# Patient Record
Sex: Female | Born: 1990
Health system: Southern US, Community
[De-identification: ages and names within clinical notes are randomized; demographics above are authoritative.]

## PROBLEM LIST (undated history)

## (undated) ENCOUNTER — Inpatient Hospital Stay (HOSPITAL_COMMUNITY): Payer: Self-pay

## (undated) DIAGNOSIS — O24419 Gestational diabetes mellitus in pregnancy, unspecified control: Secondary | ICD-10-CM

## (undated) DIAGNOSIS — D649 Anemia, unspecified: Secondary | ICD-10-CM

---

## 2014-02-13 DIAGNOSIS — D509 Iron deficiency anemia, unspecified: Secondary | ICD-10-CM | POA: Insufficient documentation

## 2014-07-24 DIAGNOSIS — F418 Other specified anxiety disorders: Secondary | ICD-10-CM | POA: Insufficient documentation

## 2017-05-25 DIAGNOSIS — Z6839 Body mass index (BMI) 39.0-39.9, adult: Secondary | ICD-10-CM | POA: Insufficient documentation

## 2017-05-25 DIAGNOSIS — Z6833 Body mass index (BMI) 33.0-33.9, adult: Secondary | ICD-10-CM | POA: Insufficient documentation

## 2017-08-21 ENCOUNTER — Ambulatory Visit: Payer: Self-pay

## 2017-08-23 ENCOUNTER — Ambulatory Visit (INDEPENDENT_AMBULATORY_CARE_PROVIDER_SITE_OTHER): Payer: Self-pay | Admitting: *Deleted

## 2017-08-23 ENCOUNTER — Encounter: Payer: Self-pay | Admitting: Family Medicine

## 2017-08-23 DIAGNOSIS — Z3201 Encounter for pregnancy test, result positive: Secondary | ICD-10-CM

## 2017-08-23 LAB — POCT PREGNANCY, URINE: Preg Test, Ur: POSITIVE — AB

## 2017-08-23 NOTE — Progress Notes (Signed)
Per Bonita QuinLinda, pregnancy test positive.  Confirmed with patient only medications she is currently taking is PNV and lexapro 20 mg QD.  Spoke with Dr. Vergie LivingPickens to verify patient should continue her lexapro.  He states patient should continue.  Made patient aware to continue both medications.  Patient is starting work with [employer] soon and will have insurance.  Will schedule appointment here in our office as she checks out.

## 2017-08-27 NOTE — Progress Notes (Signed)
I have reviewed the chart and agree with nursing staff's documentation of this patient's encounter.  [place] Bingharlie Dimas Scheck, MD 08/27/2017 5:09 PM

## 2017-09-15 ENCOUNTER — Emergency Department (HOSPITAL_COMMUNITY)
Admission: EM | Admit: 2017-09-15 | Discharge: 2017-09-15 | Disposition: A | Discharge status: Home / Self Care | Payer: Self-pay | Attending: Emergency Medicine | Admitting: Emergency Medicine

## 2017-09-15 ENCOUNTER — Other Ambulatory Visit: Payer: Self-pay

## 2017-09-15 ENCOUNTER — Encounter (HOSPITAL_COMMUNITY): Payer: Self-pay

## 2017-09-15 DIAGNOSIS — Z87891 Personal history of nicotine dependence: Secondary | ICD-10-CM | POA: Insufficient documentation

## 2017-09-15 DIAGNOSIS — K0889 Other specified disorders of teeth and supporting structures: Secondary | ICD-10-CM | POA: Insufficient documentation

## 2017-09-15 DIAGNOSIS — Z79899 Other long term (current) drug therapy: Secondary | ICD-10-CM | POA: Insufficient documentation

## 2017-09-15 MED ORDER — PENICILLIN V POTASSIUM 500 MG PO TABS: 500.0000 mg | ORAL_TABLET | Freq: Four times a day (QID) | ORAL | 0 refills | Status: AC

## 2017-09-15 NOTE — ED Triage Notes (Signed)
Dental pain x 3 days. Pt is pregnant.  Only taking tylenol.  Unknown for home fever.  Swelling on left side.

## 2017-09-15 NOTE — ED Notes (Signed)
Bed: WTR6 Expected date:  Expected time:  Means of arrival:  Comments: 

## 2017-09-15 NOTE — ED Notes (Signed)
PA at bedside.

## 2017-09-15 NOTE — ED Provider Notes (Signed)
[facility]-EMERGENCY DEPT Provider Note   CSN: [id number] Arrival date & time: 09/15/17  1125     History   Chief Complaint Chief Complaint  Patient presents with  . Dental Pain    HPI Kelly Morales is a 27 y.o. female.  HPI   Pt is a 27 y/o female with no significant past medical history who presents to the ED today c/o left lower dental pain that has been present for the last 4 days. States she also has swelling to left lower area of the mouth. States she has been taking tylenol and orajel with mild relief. States pain is 7-8/10. Pain is constant. Denies any known drainage from the area and denies any documented fevers at home. Reports left sided ear pain. But no significant headaches, chset pain, SOB, abd pain, NV.  Has been able to eat and drink at home though this is painful.  Pt states she is [redacted] weeks pregnant by LMP. States she has not had an US yet but is scheduled for one next week and is currently set up with ob-gyn.   She states she does not have a dentist.  History reviewed. No pertinent past medical history.  There are no active problems to display for this patient.   History reviewed. No pertinent surgical history.   OB History    Gravida  1   Para      Term      Preterm      AB      Living        SAB      TAB      Ectopic      Multiple      Live Births               Home Medications    Prior to Admission medications   Medication Sig Start Date End Date Taking? Authorizing Provider  escitalopram (LEXAPRO) 20 MG tablet Take 20 mg by mouth daily.    [provider]  penicillin v potassium (VEETID) 500 MG tablet Take 1 tablet (500 mg total) by mouth 4 (four) times daily for 7 days. 09/15/17 09/22/17  Luwana Butrick S, PA-C  Prenatal Vit-Fe Fumarate-FA (MULTIVITAMIN-PRENATAL) 27-0.8 MG TABS tablet Take 1 tablet by mouth daily at 12 noon.    [provider]    Family History History  reviewed. No pertinent family history.  Social History Social History   Tobacco Use  . Smoking status: Former Games developermoker  . Smokeless tobacco: Never Used  Substance Use Topics  . Alcohol use: Not Currently  . Drug use: Never     Allergies   Patient has no known allergies.   Review of Systems Review of Systems  Constitutional: Negative for fever.  HENT: Positive for dental problem, ear pain and facial swelling. Negative for congestion, rhinorrhea, sore throat and trouble swallowing.   Eyes: Negative for visual disturbance.  Respiratory: Negative for cough and shortness of breath.   Cardiovascular: Negative for chest pain.  Gastrointestinal: Negative for abdominal pain, nausea and vomiting.  Genitourinary: Negative for dysuria, pelvic pain and urgency.  Musculoskeletal: Negative for myalgias.  Skin: Negative for wound.  Neurological: Negative for dizziness, weakness, light-headedness, numbness and headaches.     Physical Exam Updated Vital Signs BP 132/77 (BP Location: Right Arm)   Pulse 82   Temp 98.5 F (36.9 C) (Oral)   Resp 18   Ht 5\' 9"  (1.753 m)   Wt 101.2 kg (  223 lb)   LMP 06/30/2017   SpO2 98%   BMI 32.93 kg/m   Physical Exam  Constitutional: She is oriented to person, place, and time. She appears well-developed and well-nourished. No distress.  HENT:  Head: Normocephalic and atraumatic.  Bilateral TMs without erythema or effusion.  Multiple dental caries.  Patient has fractured tooth at tooth #19.  No obvious dental abscess.  She does have some dependent swelling to the lower left mandible.  No swelling to the neck.  No fluctuance noted.  No obvious fluid collection.  No swelling beneath the tongue.  Tolerating secretions.  No evidence of PTA or retropharyngeal abscess.  Eyes: Conjunctivae are normal.  Neck: Neck supple.  Cardiovascular: Normal rate, regular rhythm, normal heart sounds and intact distal pulses.  No murmur heard. Pulmonary/Chest: Effort  normal and breath sounds normal. No respiratory distress.  Abdominal: Soft. There is no tenderness.  Musculoskeletal: She exhibits no edema.  Lymphadenopathy:    She has no cervical adenopathy.  Neurological: She is alert and oriented to person, place, and time. No cranial nerve deficit.  Skin: Skin is warm and dry. Capillary refill takes less than 2 seconds.  Psychiatric: She has a normal mood and affect.  Nursing note and vitals reviewed.   ED Treatments / Results  Labs (all labs ordered are listed, but only abnormal results are displayed) Labs Reviewed - No data to display  EKG None  Radiology No results found.  Procedures Procedures (including critical care time)  Medications Ordered in ED Medications - No data to display   Initial Impression / Assessment and Plan / ED Course  I have reviewed the triage vital signs and the nursing notes.  Pertinent labs & imaging results that were available during my care of the patient were reviewed by me and considered in my medical decision making (see chart for details).     Final Clinical Impressions(s) / ED Diagnoses   Final diagnoses:  Pain, dental   Patient with toothache.  No gross abscess.  Exam unconcerning for Ludwig's angina or spread of infection.  Will treat with penicillin and tylenol.  Urged patient to follow-up with dentist.  Referral given.  Patient given strict return precautions for any new or worsening symptoms.  She understands plan reasons to return immediately to the ED.  ED Discharge Orders        Ordered    penicillin v potassium (VEETID) 500 MG tablet  4 times daily     09/15/17 1258       Juni Glaab S, PA-C 09/15/17 1300    Tilden Fossa, MD 09/15/17 [id number]

## 2017-09-15 NOTE — Discharge Instructions (Addendum)
You were given a prescription for antibiotics. Please take the antibiotic prescription fully.   You were given a referral to a dentist.  You may make an appointment for follow-up with his dentist.  You are also given information for the [facility] whom you may contact to establish primary care with.  Please follow-up in the emergency department if you have any fevers, swelling to the neck, swelling beneath the tongue, inability to swallow, or any new or worsening symptoms.

## 2017-09-18 ENCOUNTER — Encounter: Payer: Self-pay | Admitting: General Practice

## 2017-09-18 ENCOUNTER — Ambulatory Visit (INDEPENDENT_AMBULATORY_CARE_PROVIDER_SITE_OTHER): Payer: Self-pay | Admitting: Advanced Practice Midwife

## 2017-09-18 ENCOUNTER — Encounter: Payer: Self-pay | Admitting: Advanced Practice Midwife

## 2017-09-18 ENCOUNTER — Ambulatory Visit (INDEPENDENT_AMBULATORY_CARE_PROVIDER_SITE_OTHER): Payer: Self-pay | Admitting: Clinical

## 2017-09-18 VITALS — BP 118/70 | HR 81 | Wt 227.0 lb

## 2017-09-18 DIAGNOSIS — F4323 Adjustment disorder with mixed anxiety and depressed mood: Secondary | ICD-10-CM

## 2017-09-18 DIAGNOSIS — K089 Disorder of teeth and supporting structures, unspecified: Secondary | ICD-10-CM

## 2017-09-18 DIAGNOSIS — Z3480 Encounter for supervision of other normal pregnancy, unspecified trimester: Secondary | ICD-10-CM

## 2017-09-18 DIAGNOSIS — Z113 Encounter for screening for infections with a predominantly sexual mode of transmission: Secondary | ICD-10-CM

## 2017-09-18 DIAGNOSIS — Z124 Encounter for screening for malignant neoplasm of cervix: Secondary | ICD-10-CM

## 2017-09-18 DIAGNOSIS — O0990 Supervision of high risk pregnancy, unspecified, unspecified trimester: Secondary | ICD-10-CM | POA: Insufficient documentation

## 2017-09-18 DIAGNOSIS — Z3481 Encounter for supervision of other normal pregnancy, first trimester: Secondary | ICD-10-CM

## 2017-09-18 DIAGNOSIS — Z3490 Encounter for supervision of normal pregnancy, unspecified, unspecified trimester: Secondary | ICD-10-CM

## 2017-09-18 MED ORDER — PROMETHAZINE HCL 25 MG PO TABS: 12.5000 mg | ORAL_TABLET | Freq: Four times a day (QID) | ORAL | 0 refills | Status: DC | PRN

## 2017-09-18 MED ORDER — ONDANSETRON 8 MG PO TBDP: 8.0000 mg | ORAL_TABLET | Freq: Three times a day (TID) | ORAL | 0 refills | Status: DC | PRN

## 2017-09-18 NOTE — Progress Notes (Signed)
Subjective:   Kelly Morales is a 27 y.o. [id number] at 463w3d by LMP being seen today for her first obstetrical visit.  Her obstetrical history is significant for none . Patient does intend to breast feed. Pregnancy history fully reviewed.  Patient reports nausea.  HISTORY: OB History  Gravida Para Term Preterm AB Living  5 2 2  0 2 2  SAB TAB Ectopic Multiple Live Births  1 1 0 0 2    # Outcome Date GA Lbr Len/2nd Weight Sex Delivery Anes PTL Lv  5 Current           4 Term 09/13/14 5785w0d  8 lb (3.629 kg) M Vag-Spont EPI N LIV  3 Term 08/30/11 4878w0d  7 lb 14 oz (3.572 kg) M Vag-Spont EPI  LIV     Name: Kelly Morales  2 TAB           1 SAB             Last pap smear was done unsure and was no history of abnormals per patient.   History reviewed. No pertinent past medical history. History reviewed. No pertinent surgical history. Family History  Problem Relation Age of Onset  . Diabetes Mother   . Hypertension Mother   . Hypertension Maternal Grandmother    Social History   Tobacco Use  . Smoking status: Former Smoker    Last attempt to quit: 2019    Years since quitting: 0.5  . Smokeless tobacco: Never Used  Substance Use Topics  . Alcohol use: Not Currently  . Drug use: Never   No Known Allergies Current Outpatient Medications on File Prior to Visit  Medication Sig Dispense Refill  . escitalopram (LEXAPRO) 20 MG tablet Take 20 mg by mouth daily.    . penicillin v potassium (VEETID) 500 MG tablet Take 1 tablet (500 mg total) by mouth 4 (four) times daily for 7 days. 28 tablet 0  . Prenatal Vit-Fe Fumarate-FA (MULTIVITAMIN-PRENATAL) 27-0.8 MG TABS tablet Take 1 tablet by mouth daily at 12 noon.     No current facility-administered medications on file prior to visit.     Review of Systems Pertinent items noted in HPI and remainder of comprehensive ROS otherwise negative.  Exam   Vitals:   09/18/17 1402  BP: 118/70  Pulse: 81  Weight: 227 lb (103 kg)    Fetal Heart Rate (bpm): 164  Uterus:     Pelvic Exam: Perineum: no hemorrhoids, normal perineum   Vulva: normal external genitalia, no lesions   Vagina:  normal mucosa, normal discharge   Cervix: no lesions and normal, pap smear done.    Adnexa: normal adnexa and no mass, fullness, tenderness   Bony Pelvis: average  System: General: well-developed, well-nourished female in no acute distress   Breast:  normal appearance, no masses or tenderness   Skin: normal coloration and turgor, no rashes   Neurologic: oriented, normal, negative, normal mood   Extremities: normal strength, tone, and muscle mass, ROM of all joints is normal   HEENT PERRLA, extraocular movement intact and sclera clear, anicteric   Mouth/Teeth mucous membranes moist, pharynx normal without lesions and dental hygiene good   Neck supple and no masses   Cardiovascular: regular rate and rhythm   Respiratory:  no respiratory distress, normal breath sounds   Abdomen: soft, non-tender; bowel sounds normal; no masses,  no organomegaly    Assessment:   Pregnancy: [id number] There are no active problems to display for this patient.  Plan:  1. Supervision of other normal pregnancy, antepartum - Cytology - PAP - Cystic fibrosis gene test - Culture, OB Urine - [facility] AMB BABYSCRIPTS OPT IN - Genetic Screening - Hemoglobinopathy Evaluation - Obstetric Panel, Including HIV - SMN1 COPY NUMBER ANALYSIS (SMA Carrier Screen) - [place] MFM OB COMP + 14 WK; Future   Initial labs drawn. Continue prenatal vitamins. Genetic Screening discussed, NIPS: requested. Ultrasound discussed; fetal anatomic survey: ordered. Problem list reviewed and updated. The nature of [facility] with multiple MDs and other Advanced Practice Providers was explained to patient; also emphasized that residents, students are part of our team. Routine obstetric precautions reviewed. 50% of 45 min visit spent in counseling  and coordination of care. Return in about 1 month (around 10/16/2017).

## 2017-09-18 NOTE — Patient Instructions (Signed)

## 2017-09-18 NOTE — BH Specialist Note (Signed)
Integrated Behavioral Health Initial Visit  MRN: [id number] Name: Kelly PiedraShykila Ajana Basich  Number of Integrated Behavioral Health Clinician visits:: 1/6 Session Start time: 3:05 Session End time: 3:35 Total time: 30 minutes  Type of Service: Integrated Behavioral Health- Individual/Family Interpretor:No. Interpretor Name and Language: n/a   Warm Hand Off Completed.       SUBJECTIVE: Kelly Morales is a [age] y.o. female accompanied by n/a Patient was referred by Thressa ShellerHeather Hogan, CNM for initial OB [facility]. Patient reports the following symptoms/concerns: Pt states her primary concern is wanting to prevent depression and anxiety during pregnancy and postpartum; feeling an increase in life stress after moving to [place] from [place].  Duration of problem: Current pregnancy; Severity of problem: mild  OBJECTIVE: Mood: Anxious and Affect: Appropriate Risk of harm to self or others: No plan to harm self or others  LIFE CONTEXT: Family and Social: Pt lives with her extended family, her husband and children School/Work: Start[place] a new [place] Self-Care: Recognizing a need for self-care Life Changes: Current pregnancy after move from [place] to [place]  GOALS ADDRESSED: Patient will: 1. Reduce symptoms of: anxiety, depression and stress 2. Increase knowledge and/or ability of: stress reduction  3. Demonstrate ability to: Increase healthy adjustment to current life circumstances  INTERVENTIONS: Interventions utilized: Mining engineerBehavioral Activation, Psychoeducation and/or Health Education and Link to [facility]  Standardized Assessments completed: GAD-7 and PHQ 9  ASSESSMENT: Patient currently experiencing Adjustment disorder with mixed anxious and depressed mood.   Patient may benefit from psychoeducation and brief therapeutic interventions regarding coping with symptoms of anxiety and depression .  PLAN: 1. Follow up with behavioral health  clinician on : One month 2. Behavioral recommendations:  -Continue taking BH medication and prenatal vitamins, as recommended by medical provider -Consider local activities to take children for the remaining of summer (as discussed in office visit) -Read educational materials regarding coping with symptoms of anxiety and depression -Go to [facility] (online or in-person) to find out more about local resources available  3. Referral(s): [facility] ([facility]) 4. "From scale of 1-10, how likely are you to follow plan?": 10  Aava Deland C Maizee Reinhold, LCSW    No flowsheet data found.  No flowsheet data found.  Depression screen PHQ 2/9 09/18/2017  Decreased Interest 2  Down, Depressed, Hopeless 1  PHQ - 2 Score 3  Altered sleeping 1  Tired, decreased energy 3  Change in appetite 2  Feeling bad or failure about yourself  1  Trouble concentrating 0  Moving slowly or fidgety/restless 0  Suicidal thoughts 0  PHQ-9 Score 10  Difficult doing work/chores Not difficult at all   GAD 7 : Generalized Anxiety Score 09/18/2017  Nervous, Anxious, on Edge 2  Control/stop worrying 2  Worry too much - different things 2  Trouble relaxing 2  Restless 0  Easily annoyed or irritable 1  Afraid - awful might happen 1  Total GAD 7 Score 10

## 2017-09-19 LAB — CYTOLOGY - PAP
Chlamydia: NEGATIVE
Diagnosis: NEGATIVE
Neisseria Gonorrhea: NEGATIVE

## 2017-09-21 LAB — URINE CULTURE, OB REFLEX

## 2017-09-21 LAB — CULTURE, OB URINE

## 2017-09-26 ENCOUNTER — Encounter: Payer: Self-pay | Admitting: *Deleted

## 2017-09-26 LAB — HEMOGLOBINOPATHY EVALUATION
Ferritin: 20 ng/mL (ref 15–150)
HGB A2 QUANT: 2.1 % (ref 1.8–3.2)
HGB C: 0 %
HGB F QUANT: 0.6 % (ref 0.0–2.0)
Hgb A: 97.3 % (ref 96.4–98.8)
Hgb S: 0 %
Hgb Solubility: NEGATIVE
Hgb Variant: 0 %

## 2017-09-26 LAB — OBSTETRIC PANEL, INCLUDING HIV
Antibody Screen: NEGATIVE
Basophils Absolute: 0 10*3/uL (ref 0.0–0.2)
Basos: 0 %
EOS (ABSOLUTE): 0.1 10*3/uL (ref 0.0–0.4)
Eos: 2 %
HEP B S AG: NEGATIVE
HIV SCREEN 4TH GENERATION: NONREACTIVE
Hematocrit: 34.7 % (ref 34.0–46.6)
Hemoglobin: 11.2 g/dL (ref 11.1–15.9)
IMMATURE GRANS (ABS): 0 10*3/uL (ref 0.0–0.1)
IMMATURE GRANULOCYTES: 0 %
LYMPHS: 26 %
Lymphocytes Absolute: 2 10*3/uL (ref 0.7–3.1)
MCH: 28.5 pg (ref 26.6–33.0)
MCHC: 32.3 g/dL (ref 31.5–35.7)
MCV: 88 fL (ref 79–97)
Monocytes Absolute: 0.4 10*3/uL (ref 0.1–0.9)
Monocytes: 6 %
NEUTROS ABS: 5 10*3/uL (ref 1.4–7.0)
NEUTROS PCT: 66 %
PLATELETS: 308 10*3/uL (ref 150–450)
RBC: 3.93 x10E6/uL (ref 3.77–5.28)
RDW: 17 % — AB (ref 12.3–15.4)
RPR: NONREACTIVE
RUBELLA: 11.3 {index} (ref >0.99)
Rh Factor: POSITIVE
WBC: 7.5 10*3/uL (ref 3.4–10.8)

## 2017-09-26 LAB — CYSTIC FIBROSIS GENE TEST

## 2017-09-26 LAB — SMN1 COPY NUMBER ANALYSIS (SMA CARRIER SCREENING)

## 2017-10-15 ENCOUNTER — Encounter: Payer: Self-pay | Admitting: Advanced Practice Midwife

## 2017-10-22 ENCOUNTER — Ambulatory Visit (INDEPENDENT_AMBULATORY_CARE_PROVIDER_SITE_OTHER): Payer: 59 | Admitting: Advanced Practice Midwife

## 2017-10-22 VITALS — BP 119/58 | HR 78 | Wt 233.0 lb

## 2017-10-22 DIAGNOSIS — Z3490 Encounter for supervision of normal pregnancy, unspecified, unspecified trimester: Secondary | ICD-10-CM

## 2017-10-22 DIAGNOSIS — Z3480 Encounter for supervision of other normal pregnancy, unspecified trimester: Secondary | ICD-10-CM

## 2017-10-22 DIAGNOSIS — N898 Other specified noninflammatory disorders of vagina: Secondary | ICD-10-CM

## 2017-10-22 DIAGNOSIS — Z3482 Encounter for supervision of other normal pregnancy, second trimester: Secondary | ICD-10-CM

## 2017-10-22 MED ORDER — BUTALBITAL-APAP-CAFFEINE 50-325-40 MG PO TABS: 1.0000 | ORAL_TABLET | Freq: Four times a day (QID) | ORAL | 0 refills | Status: DC | PRN

## 2017-10-22 NOTE — Progress Notes (Signed)
Pt reports she is having some sharp stabbing pain in abdomen. She is aware that these are most likely round ligament pain. Pt also states she has been having frequent migraines and wants to know what she can take.

## 2017-10-22 NOTE — Progress Notes (Signed)
   PRENATAL VISIT NOTE  Subjective:  Kelly Morales is a 27 y.o. [id number] at 4541w2d being seen today for ongoing prenatal care.  She is currently monitored for the following issues for this low-risk pregnancy and has Supervision of low-risk pregnancy and Poor dentition on their problem list.  Patient reports no complaints.  Contractions: Not present. Vag. Bleeding: None.  Movement: Absent. Denies leaking of fluid.   The following portions of the patient's history were reviewed and updated as appropriate: allergies, current medications, past family history, past medical history, past social history, past surgical history and problem list. Problem list updated.  Objective:   Vitals:   10/22/17 1822  BP: (!) 119/58  Pulse: 78  Weight: 233 lb (105.7 kg)    Fetal Status: Fetal Heart Rate (bpm): 148   Movement: Absent     General:  Alert, oriented and cooperative. Patient is in no acute distress.  Skin: Skin is warm and dry. No rash noted.   Cardiovascular: Normal heart rate noted  Respiratory: Normal respiratory effort, no problems with respiration noted  Abdomen: Soft, gravid, appropriate for gestational age.  Pain/Pressure: Present     Pelvic: Cervical exam deferred        Extremities: Normal range of motion.     Mental Status: Normal mood and affect. Normal behavior. Normal judgment and thought content.   Assessment and Plan:  Pregnancy: [id number] at 7941w2d  1. Supervision of other normal pregnancy, antepartum - Routine care - RX Fioricet for migraines   - Plan for AFP at next visit   Preterm labor symptoms and general obstetric precautions including but not limited to vaginal bleeding, contractions, leaking of fluid and fetal movement were reviewed in detail with the patient. Please refer to After Visit Summary for other counseling recommendations.  Return in about 3 weeks (around 11/12/2017).  Future Appointments  Date Time Provider Department Center  11/12/2017  4:00 PM  [facility] US 3 [facility]    Thressa ShellerHeather Hogan, CNM

## 2017-10-22 NOTE — Patient Instructions (Addendum)
AREA PEDIATRIC/FAMILY PRACTICE PHYSICIANS  [facility] [address], [address] [place], [place]  [address] Phone - [phone]   Fax - [phone]  [facility] [address] [place], [place] [address] Phone - [phone]   Fax - [phone]  JACK AMOS [address] [place], [place]  [address] Phone - [phone]   Fax - [phone]  [facility] [address] [place], [place]  [address] Phone - [phone]   Fax - [phone]  [facility] [address] [place], [place]  [address] Phone - [phone]   Fax - [phone]  [facility] [address], [address] [place], [place]  [address] Phone - [phone]   Fax - [phone]  [facility] [address] [place], [place]  [address] Phone - [phone]   Fax - [phone]  [facility] [address], [address] [place], [place]  [address] Phone - [phone]   Fax - [phone]  [facility] [address] [place], [place]  [address] Phone - [phone]   Fax - [phone] [facility] [address] [place], [place]  [address] Phone - [phone]   Fax - [phone]  [facility] [address] [place], [place]  [address] Phone - [phone]   Fax - [phone]  [facility] [address] [place], [place]  [address] Phone - [phone]   Fax - [phone]  [facility] [address] [place], [place]  [address] Phone - [phone]   Fax - [phone]  SHILPA GOSRANI [address] [place], [place]  [address] Phone - [phone]  [facility] [address] [place], [place]  [address] Phone - [phone]   Fax - [phone]  [facility] [address] [place], [place]  [address] Phone - [phone]   Fax - [phone]  [facility] [address] [place], [place]  [address] Phone - [phone]   Fax - [phone]  [facility] [address] [place], [place]  [address] Phone - [phone]   Fax - [phone]   [facility] [address] [place], [place]  [address] Phone - [phone]   Fax - [phone]  [facility] [address] [place], [place]  [address] Phone - [phone]   Fax - [phone]  DAVID RUBIN [address] [place], [place]  [address] Phone - [phone]   Fax - [phone]  [facility] [address] , [place]  [address] Phone - [phone]   Fax - [phone]  [facility] [address] , [place]  [address] Phone - [phone]   Fax - [phone] [facility] [address] [place], [place]  [address] Phone - [phone]   Fax - [phone]  [facility] [address] [place], [place]  [address] Phone - [phone]   Fax - [phone]  [facility] [address] [place], [place]  [address] Phone - [phone]   Fax - [phone]  [facility] Charlene Flemming MD [address]  [place] [address] Phone [phone]  Fax [phone]  Childbirth Education Options: [place] Classes:  Childbirth education classes can help you   get ready for a positive parenting experience. You can also meet other expectant parents and get free stuff for your baby. Each class runs for five weeks on the same night and costs $45 for the mother-to-be and her support person. Medicaid covers the cost if you are eligible. Call [phone] to register. [facility] Childbirth Education:  [phone] or [phone] or  [email or web address]  Baby & Me Class: Discuss newborn & infant parenting and family adjustment issues with other new mothers in a relaxed environment. Each week brings a new speaker or baby-centered activity. We encourage new mothers to join us every Thursday at 11:00am. Babies birth until crawling. No registration or fee. Daddy Boot Camp: This course offers Dads-to-be the tools and knowledge needed to feel confident on their journey to becoming new fathers. Experienced dads, who have been trained as coaches, teach dads-to-be how to hold, comfort, diaper, swaddle and play with their infant while being able to support the new mom as well. A class for men taught by men. $25/dad Big Brother/Big Sister: Let your children share in the joy of a new brother or sister in this special class designed just for them. Class includes discussion about how families care for babies: swaddling, holding, diapering, safety as well as how they can be helpful in their new role. This class is designed for children ages 2 to 6, but any age is welcome. Please register each child individually. $5/child  Mom Talk: This mom-led group offers support and connection to mothers as they journey through the adjustments and struggles of that sometimes overwhelming first year after the birth of a child. Tuesdays at 10:00am and Thursdays at 6:00pm. Babies welcome. No registration or fee. Breastfeeding Support Group: This group is a mother-to-mother support circle where moms have the opportunity to share their breastfeeding experiences. A Lactation Consultant is present for questions and concerns. Meets each Tuesday at 11:00am. No fee or registration. Breastfeeding Your Baby: Learn what to expect in the first days of breastfeeding your newborn.  This class will help you feel more confident with the skills needed to begin your breastfeeding experience. Many new mothers are concerned about breastfeeding after leaving the hospital. This class  will also address the most common fears and challenges about breastfeeding during the first few weeks, months and beyond. (call for fee) Comfort Techniques and Tour: This 2 hour interactive class will provide you the opportunity to learn & practice hands-on techniques that can help relieve some of the discomfort of labor and encourage your baby to rotate toward the best position for birth. You and your partner will be able to try a variety of labor positions with birth balls and rebozos as well as practice breathing, relaxation, and visualization techniques. A tour of the [facility] is included with this class. $20 per registrant and support person Childbirth Class- Weekend Option: This class is a Weekend version of our Birth & Baby series. It is designed for parents who have a difficult time fitting several weeks of classes into their schedule. It covers the care of your newborn and the basics of labor and childbirth. It also includes a Maternity Care Center Tour of [facility] and lunch. The class is held two consecutive days: beginning on Friday evening from 6:30 - 8:30 p.m. and the next day, Saturday from 9 a.m. - 4 p.m. (call for fee) Waterbirth Class: Interested in a waterbirth?  This informational class will help you discover whether waterbirth is the right fit for you.   Education about waterbirth itself, supplies you would need and how to assemble your support team is what you can expect from this class. Some obstetrical practices require this class in order to pursue a waterbirth. (Not all obstetrical practices offer waterbirth-check with your healthcare provider.) Register only the expectant mom, but you are encouraged to bring your partner to class! Required if planning waterbirth, no fee. Infant/Child CPR: Parents, grandparents, babysitters, and friends learn Cardio-Pulmonary Resuscitation skills for infants and children. You will also learn how to treat both conscious  and unconscious choking in infants and children. This Family & Friends program does not offer certification. Register each participant individually to ensure that enough mannequins are available. (Call for fee) Grandparent Love: Expecting a grandbaby? This class is for you! Learn about the latest infant care and safety recommendations and ways to support your own child as he or she transitions into the parenting role. Taught by Registered Nurses who are childbirth instructors, but most importantly...they are grandmothers too! $10/person. Childbirth Class- Natural Childbirth: This series of 5 weekly classes is for expectant parents who want to learn and practice natural methods of coping with the process of labor and childbirth. Relaxation, breathing, massage, visualization, role of the partner, and helpful positioning are highlighted. Participants learn how to be confident in their body's ability to give birth. This class will empower and help parents make informed decisions about their own care. Includes discussion that will help new parents transition into the immediate postpartum period. [facility] is included. We suggest taking this class between 25-32 weeks, but it's only a recommendation. $75 per registrant and one support person or $30 Medicaid. Childbirth Class- 3 week Series: This option of 3 weekly classes helps you and your labor partner prepare for childbirth. Newborn care, labor & birth, cesarean birth, pain management, and comfort techniques are discussed and a [place] [facility] is included. The class meets at the same time, on the same day of the week for 3 consecutive weeks beginning with the starting date you choose. $60 for registrant and one support person.  Marvelous Multiples: Expecting twins, triplets, or more? This class covers the differences in labor, birth, parenting, and breastfeeding issues that face multiples' parents.  NICU tour is included. Led by a Certified Childbirth Educator who is the mother of twins. No fee. Caring for Baby: This class is for expectant and adoptive parents who want to learn and practice the most up-to-date newborn care for their babies. Focus is on birth through the first six weeks of life. Topics include feeding, bathing, diapering, crying, umbilical cord care, circumcision care and safe sleep. Parents learn to recognize symptoms of illness and when to call the pediatrician. Register only the mom-to-be and your partner or support person can plan to come with you! $10 per registrant and support person Childbirth Class- online option: This online class offers you the freedom to complete a Birth and Baby series in the comfort of your own home. The flexibility of this option allows you to review sections at your own pace, at times convenient to you and your support people. It includes additional video information, animations, quizzes, and extended activities. Get organized with helpful eClass tools, checklists, and trackers. Once you register online for the class, you will receive an email within a few days to accept the invitation and begin the class when the time is right for you. The content will be available to you for 60 days. $  60 for 60 days of online access for you and your support people.  Local Doulas: Natural Baby Doulas [email or web address] Tel: [phone] [email or web address] [facility] [phone] [email or web address] The Labor Ladies  (also do waterbirth tub rental) [phone] [email or web address] [facility] [phone] [email or web address] Sacred [employer]hythms  [phone] [email or web address] [place] [facility] (PADA) [email or web address] http://www.padanc.org/index.htm [place] [facility]   [email or web address] Considering Waterbirth? Guide for patients at [facility]  Why consider waterbirth?  . Gentle birth for babies . Less pain medicine used in labor . May allow for passive descent/less pushing . May reduce perineal tears  . More mobility and instinctive maternal position changes . Increased maternal relaxation . Reduced blood pressure in labor  Is waterbirth safe? What are the risks of infection, drowning or other complications?  . Infection: o Very low risk (3.7 % for tub vs 4.8% for bed) o 7 in 8000 waterbirths with documented infection o Poorly cleaned equipment most common cause o Slightly lower group B strep transmission rate  . Drowning o Maternal:  - Very low risk   - Related to seizures or fainting o Newborn:  - Very low risk. No evidence of increased risk of respiratory problems in multiple large studies - Physiological protection from breathing under water - Avoid underwater birth if there are any fetal complications - Once baby's head is out of the water, keep it out.  . Birth complication o Some reports of cord trauma, but risk decreased by bringing baby to surface gradually o No evidence of increased risk of shoulder dystocia. Mothers can usually change positions faster in water than in a bed, possibly aiding the maneuvers to free the shoulder.   You must attend a Waterbirth class at [facility]  3rd Wednesday of every month from 7-9pm  Free  Register by calling [phone] or online at [email or web address]  Bring us the certificate from the class to your prenatal appointment  Meet with a midwife at 36 weeks to see if you can still plan a waterbirth and to sign the consent.   Purchase or rent the following supplies:   Water Birth Pool (Birth Pool in a Box or [place] for instance)  (Tubs start ~$125)  Single-use disposable tub liner designed for your brand of tub  New garden hose labeled  "lead-free", "suitable for drinking water",  Electric drain pump to remove water (We recommend 792 gallon per hour or greater pump.)   Separate garden hose to remove the dirty water  Fish net  Bathing suit top (optional)  Long-handled mirror (optional)  Places to purchase or rent supplies  Yourwaterbirth.com for tub purchases and supplies  Waterbirthsolutions.com for tub purchases and supplies  The Labor Ladies (www.[email or web address]) $275 for tub rental/set-up & take down/kit   [place] [facility] (http://www.padanc.org/MeetUs.htm) Information regarding doulas (labor support) who provide pool rentals  Our practice has a Birth Pool in a Box tub at the hospital that you may borrow on a first-come-first-served basis. It is your responsibility to to set up, clean and break down the tub. We cannot guarantee the availability of this tub in advance. You are responsible for bringing all accessories listed above. If you do not have all necessary supplies you cannot have a waterbirth.    Things that would prevent you from having a waterbirth:  Premature, <37wks  Previous cesarean birth  Presence of thick meconium-stained fluid  Multiple gestation (Twins, triplets, etc.)  Uncontrolled diabetes or gestational diabetes requiring medication  Hypertension requiring medication   or diagnosis of pre-eclampsia  Heavy vaginal bleeding  Non-reassuring fetal heart rate  Active infection (MRSA, etc.). Group B Strep is NOT a contraindication for  waterbirth.  If your labor has to be induced and induction method requires continuous  monitoring of the baby's heart rate  Other risks/issues identified by your obstetrical provider  Please remember that birth is unpredictable. Under certain unforeseeable circumstances your provider may advise against giving birth in the tub. These decisions will be made on a case-by-case basis and with the safety of you and your baby as our highest  priority.      

## 2017-11-06 ENCOUNTER — Encounter (HOSPITAL_COMMUNITY): Payer: Self-pay

## 2017-11-12 ENCOUNTER — Other Ambulatory Visit (HOSPITAL_COMMUNITY): Payer: Self-pay | Admitting: *Deleted

## 2017-11-12 ENCOUNTER — Encounter (HOSPITAL_COMMUNITY): Payer: Self-pay

## 2017-11-12 ENCOUNTER — Ambulatory Visit (HOSPITAL_BASED_OUTPATIENT_CLINIC_OR_DEPARTMENT_OTHER)
Admission: RE | Admit: 2017-11-12 | Discharge: 2017-11-12 | Disposition: A | Discharge status: Home / Self Care | Payer: 59 | Source: Ambulatory Visit | Attending: Advanced Practice Midwife | Admitting: Advanced Practice Midwife

## 2017-11-12 ENCOUNTER — Other Ambulatory Visit: Payer: Self-pay

## 2017-11-12 ENCOUNTER — Emergency Department (HOSPITAL_COMMUNITY)
Admission: EM | Admit: 2017-11-12 | Discharge: 2017-11-12 | Disposition: A | Discharge status: Home / Self Care | Payer: 59 | Attending: Emergency Medicine | Admitting: Emergency Medicine

## 2017-11-12 DIAGNOSIS — O26892 Other specified pregnancy related conditions, second trimester: Secondary | ICD-10-CM | POA: Diagnosis not present

## 2017-11-12 DIAGNOSIS — N3000 Acute cystitis without hematuria: Secondary | ICD-10-CM | POA: Diagnosis not present

## 2017-11-12 DIAGNOSIS — Z87891 Personal history of nicotine dependence: Secondary | ICD-10-CM | POA: Diagnosis not present

## 2017-11-12 DIAGNOSIS — E860 Dehydration: Secondary | ICD-10-CM | POA: Diagnosis not present

## 2017-11-12 DIAGNOSIS — Z3A20 20 weeks gestation of pregnancy: Secondary | ICD-10-CM | POA: Diagnosis not present

## 2017-11-12 DIAGNOSIS — R109 Unspecified abdominal pain: Secondary | ICD-10-CM

## 2017-11-12 DIAGNOSIS — Z79899 Other long term (current) drug therapy: Secondary | ICD-10-CM | POA: Insufficient documentation

## 2017-11-12 DIAGNOSIS — Z3A19 19 weeks gestation of pregnancy: Secondary | ICD-10-CM | POA: Diagnosis not present

## 2017-11-12 DIAGNOSIS — Z3480 Encounter for supervision of other normal pregnancy, unspecified trimester: Secondary | ICD-10-CM

## 2017-11-12 DIAGNOSIS — R1030 Lower abdominal pain, unspecified: Secondary | ICD-10-CM | POA: Diagnosis not present

## 2017-11-12 DIAGNOSIS — Z362 Encounter for other antenatal screening follow-up: Secondary | ICD-10-CM

## 2017-11-12 DIAGNOSIS — Z363 Encounter for antenatal screening for malformations: Secondary | ICD-10-CM | POA: Diagnosis not present

## 2017-11-12 DIAGNOSIS — Z3492 Encounter for supervision of normal pregnancy, unspecified, second trimester: Secondary | ICD-10-CM

## 2017-11-12 DIAGNOSIS — O99282 Endocrine, nutritional and metabolic diseases complicating pregnancy, second trimester: Secondary | ICD-10-CM | POA: Diagnosis not present

## 2017-11-12 LAB — URINALYSIS, ROUTINE W REFLEX MICROSCOPIC
Bilirubin Urine: NEGATIVE
GLUCOSE, UA: NEGATIVE mg/dL
HGB URINE DIPSTICK: NEGATIVE
Ketones, ur: NEGATIVE mg/dL
NITRITE: NEGATIVE
PH: 7 (ref 5.0–8.0)
Protein, ur: 30 mg/dL — AB
Specific Gravity, Urine: 1.031 — ABNORMAL HIGH (ref 1.005–1.030)

## 2017-11-12 MED ORDER — SODIUM CHLORIDE 0.9 % IV BOLUS
1000.0000 mL | Freq: Once | INTRAVENOUS | Status: AC
  Administered 2017-11-12: 1000 mL via INTRAVENOUS

## 2017-11-12 MED ORDER — CEPHALEXIN 500 MG PO CAPS: 500.0000 mg | ORAL_CAPSULE | Freq: Three times a day (TID) | ORAL | 0 refills | Status: DC

## 2017-11-12 MED ORDER — CEPHALEXIN 500 MG PO CAPS
500.0000 mg | ORAL_CAPSULE | Freq: Once | ORAL | Status: AC
  Administered 2017-11-12: 500 mg via ORAL
  Filled 2017-11-12: qty 1

## 2017-11-12 NOTE — ED Notes (Signed)
OB Rapid Response nurse called. Patient is [redacted] weeks pregnant and c/o lower abdominal pain and pressure and vaginal pressure. OB rapid Response nurse stated a doppler heart rate since patient was [redacted] weeks pregnant.

## 2017-11-12 NOTE — ED Triage Notes (Signed)
Patient reports that she is [redacted] weeks pregnant. patient c/o abdominal pain and vaginal pressure since yesterday and began getting worse since 1800 yesterday.

## 2017-11-12 NOTE — Discharge Instructions (Addendum)
Follow up with your ob-gyn today at 4pm as scheduled

## 2017-11-13 NOTE — ED Provider Notes (Signed)
[facility]-EMERGENCY DEPT Provider Note   CSN: [id number] Arrival date & time: 11/12/17  1133     History   Chief Complaint Chief Complaint  Patient presents with  . [redacted] weeks pregnant  . Abdominal Pain  . vaginal pressure    HPI Kelly Morales is a 27 y.o. female.  HPI 27 year old G4, P2 A1 presents the emergency department approximately [redacted] weeks pregnant with complaints of mild lower abdominal discomfort and vaginal pressure since yesterday.  She continues to feel the baby move.  She is scheduled for her first ultrasound of this pregnancy later this afternoon.  She did not have a first trimester ultrasound.  Denies nausea vomiting.  Feels the baby move.  No history of ectopic pregnancy.  Reports some decreased oral intake.  Some urinary frequency.   History reviewed. No pertinent past medical history.  Patient Active Problem List   Diagnosis Date Noted  . Supervision of low-risk pregnancy 09/18/2017  . Poor dentition 09/18/2017    History reviewed. No pertinent surgical history.   OB History    Gravida  5   Para  2   Term  2   Preterm      AB  2   Living  2     SAB  1   TAB  1   Ectopic      Multiple      Live Births  2            Home Medications    Prior to Admission medications   Medication Sig Start Date End Date Taking? Authorizing Provider  aspirin-acetaminophen-caffeine (EXCEDRIN MIGRAINE) [phone] MG tablet Take 2 tablets by mouth daily as needed for migraine.   Yes [provider]  escitalopram (LEXAPRO) 20 MG tablet Take 10 mg by mouth daily.    Yes [provider]  Prenatal Vit-Fe Fumarate-FA (MULTIVITAMIN-PRENATAL) 27-0.8 MG TABS tablet Take 1 tablet by mouth daily at 12 noon.   Yes [provider]  butalbital-acetaminophen-caffeine (FIORICET, ESGIC) 50-325-40 MG tablet Take 1-2 tablets by mouth every 6 (six) hours as needed for headache. Patient not taking: Reported on  11/12/2017 10/22/17 10/22/18  Thressa Sheller D, CNM  cephALEXin (KEFLEX) 500 MG capsule Take 1 capsule (500 mg total) by mouth 3 (three) times daily. 11/12/17   Azalia Bilis, MD  ondansetron (ZOFRAN ODT) 8 MG disintegrating tablet Take 1 tablet (8 mg total) by mouth every 8 (eight) hours as needed for nausea or vomiting. Patient not taking: Reported on 10/22/2017 09/18/17   Armando Reichert, CNM  promethazine (PHENERGAN) 25 MG tablet Take 0.5-1 tablets (12.5-25 mg total) by mouth every 6 (six) hours as needed for nausea or vomiting. Patient not taking: Reported on 10/22/2017 09/18/17   Armando Reichert, CNM    Family History Family History  Problem Relation Age of Onset  . Diabetes Mother   . Hypertension Mother   . Hypertension Maternal Grandmother     Social History Social History   Tobacco Use  . Smoking status: Former Smoker    Last attempt to quit: 2019    Years since quitting: 0.6  . Smokeless tobacco: Never Used  Substance Use Topics  . Alcohol use: Not Currently  . Drug use: Never     Allergies   Patient has no known allergies.   Review of Systems Review of Systems  All other systems reviewed and are negative.    Physical Exam Updated Vital Signs BP 113/74 (BP Location: Left  Arm)   Pulse 90   Temp 99.1 F (37.3 C) (Oral)   Resp 16   Ht 5' 9.5" (1.765 m)   Wt 106.1 kg   LMP 06/30/2017 (Exact Date)   SpO2 100%   BMI 34.06 kg/m   Physical Exam  Constitutional: She is oriented to person, place, and time. She appears well-developed and well-nourished. No distress.  HENT:  Head: Normocephalic and atraumatic.  Eyes: EOM are normal.  Neck: Normal range of motion.  Cardiovascular: Normal rate and regular rhythm.  Pulmonary/Chest: Effort normal and breath sounds normal.  Abdominal: Soft. She exhibits no distension. There is no tenderness.  Gravid uterus consistent with dates  Musculoskeletal: Normal range of motion.  Neurological: She is alert and oriented to  person, place, and time.  Skin: Skin is warm and dry.  Psychiatric: She has a normal mood and affect.  Nursing note and vitals reviewed.    ED Treatments / Results  Labs (all labs ordered are listed, but only abnormal results are displayed) Labs Reviewed  URINALYSIS, ROUTINE W REFLEX MICROSCOPIC - Abnormal; Notable for the following components:      Result Value   APPearance CLOUDY (*)    Specific Gravity, Urine 1.031 (*)    Protein, ur 30 (*)    Leukocytes, UA LARGE (*)    Bacteria, UA RARE (*)    All other components within normal limits  URINE CULTURE    EKG None  Radiology none  Procedures [place] bedside Performed by: Azalia Bilis, MD Authorized by: Azalia Bilis, MD     EMERGENCY DEPARTMENT [place] PREGNANCY "Study: Limited Ultrasound of the Pelvis for Pregnancy"  INDICATIONS:Pregnancy(required) and Abdominal or pelvic pain Multiple views of the uterus and pelvic cavity were obtained in real-time with a multi-frequency probe.  APPROACH:Transabdominal  PERFORMED BY: Myself IMAGES ARCHIVED?: Yes LIMITATIONS: none PREGNANCY FREE FLUID: None ADNEXAL FINDINGS:Left ovary not seen and Right ovary not seen GESTATIONAL AGE, ESTIMATE: 20 weeks 2 days based on BPD and Femur length FETAL HEART RATE: 145 INTERPRETATION: Fetal pole present and Fetal heart activity seen      Medications Ordered in ED Medications  sodium chloride 0.9 % bolus 1,000 mL (0 mLs Intravenous Stopped 11/12/17 1532)  cephALEXin (KEFLEX) capsule 500 mg (500 mg Oral Given 11/12/17 1437)     Initial Impression / Assessment and Plan / ED Course  I have reviewed the triage vital signs and the nursing notes.  Pertinent labs & imaging results that were available during my care of the patient were reviewed by me and considered in my medical decision making (see chart for details).     Bedside ultrasound demonstrates intrauterine single gestation with normal fetal heart rate.  Suspect urinary tract  infection.  Home with antibiotics.  IV fluids given.  Patient feels better.  Discharged home with ongoing OB follow-up as scheduled later today.  Final Clinical Impressions(s) / ED Diagnoses   Final diagnoses:  Acute cystitis without hematuria  Acute abdominal pain  Dehydration  Pregnant and not yet delivered in second trimester    ED Discharge Orders         Ordered    cephALEXin (KEFLEX) 500 MG capsule  3 times daily     11/12/17 1504           Azalia Bilis, MD 11/13/17 [id number]

## 2017-11-14 LAB — URINE CULTURE

## 2017-11-15 ENCOUNTER — Telehealth: Payer: 59 | Admitting: Physician Assistant

## 2017-11-15 DIAGNOSIS — Z3490 Encounter for supervision of normal pregnancy, unspecified, unspecified trimester: Secondary | ICD-10-CM

## 2017-11-15 DIAGNOSIS — N941 Unspecified dyspareunia: Secondary | ICD-10-CM

## 2017-11-15 NOTE — Progress Notes (Signed)
For the safety of you and your child, I recommend a face to face office visit with a health care provider.  Many mothers need to take medicines during their pregnancy and while nursing.  Almost all medicines pass into the breast milk in small quantities.  Most are generally considered safe for a mother to take but some medicines must be avoided.  After reviewing your E-Visit request, I recommend that you consult your OB/GYN or pediatrician for medical advice in relation to your condition and prescription medications while pregnant or breastfeeding.   If you are having a true medical emergency please call 911.  If you need an urgent face to face visit, [place] has four urgent care centers for your convenience.  If you need care fast and have a high deductible or no insurance consider:   [email or web address] to reserve your spot online an avoid wait times  [facility] [address], [address] [place], [place] [address] 8 am to 8 pm Monday-Friday 10 am to 4 pm Saturday-Sunday *Across the street from [employer]  [facility]  [address] [place], [address] 8 am to 5 pm Monday-Friday * In the [facility] on the [facility]   The following sites will take your  insurance:  . [facility]  [phone] Get Driving Directions Find a Provider at this Location  [address] [place], [place] [address] . 10 am to 8 pm Monday-Friday . 12 pm to 8 pm Saturday-Sunday a  . [facility]  [phone] Get Driving Directions Find a Provider at this Location  [address], [address] [place], [place] [address] . 8 am to 8 pm Monday-Friday . 9 am to 6 pm Saturday . 11 am to 6 pm Sunday   . [facility] [place]  [phone] Get Driving Directions  [address] [place], [place] [address] . 8 am to 8 pm Monday-Friday . 8 am to 4 pm  Saturday-Sunday   Your e-visit answers were reviewed by a board certified advanced clinical practitioner to complete your personal care plan.  Thank you for using e-Visits.

## 2017-11-16 MED ORDER — FLUCONAZOLE 150 MG PO TABS: 150.0000 mg | ORAL_TABLET | Freq: Once | ORAL | 0 refills | Status: AC

## 2017-11-16 NOTE — Addendum Note (Signed)
Addended by: Ernestina PatchesAPEL, Zeniah Briney S on: 11/16/2017 11:30 AM   Modules accepted: Orders

## 2017-11-19 ENCOUNTER — Ambulatory Visit (INDEPENDENT_AMBULATORY_CARE_PROVIDER_SITE_OTHER): Payer: 59 | Admitting: Advanced Practice Midwife

## 2017-11-19 ENCOUNTER — Encounter: Payer: Self-pay | Admitting: Advanced Practice Midwife

## 2017-11-19 VITALS — BP 116/70 | HR 66 | Wt 233.2 lb

## 2017-11-19 DIAGNOSIS — Z3490 Encounter for supervision of normal pregnancy, unspecified, unspecified trimester: Secondary | ICD-10-CM

## 2017-11-19 DIAGNOSIS — Z3480 Encounter for supervision of other normal pregnancy, unspecified trimester: Secondary | ICD-10-CM | POA: Diagnosis not present

## 2017-11-19 DIAGNOSIS — R002 Palpitations: Secondary | ICD-10-CM

## 2017-11-19 NOTE — Progress Notes (Signed)
   PRENATAL VISIT NOTE  Subjective:  Kelly PiedraShykila Ajana Harding is a 27 y.o. [id number] at 1173w2d being seen today for ongoing prenatal care.  She is currently monitored for the following issues for this low-risk pregnancy and has Supervision of low-risk pregnancy and Poor dentition on their problem list.  Patient reports palpitations.  Contractions: Not present. Vag. Bleeding: None.  Movement: Present. Denies leaking of fluid.   The following portions of the patient's history were reviewed and updated as appropriate: allergies, current medications, past family history, past medical history, past social history, past surgical history and problem list. Problem list updated.  Objective:   Vitals:   11/19/17 1616  BP: 116/70  Pulse: 66  Weight: 233 lb 3.2 oz (105.8 kg)    Fetal Status: Fetal Heart Rate (bpm): 154 Fundal Height: 21 cm Movement: Present     General:  Alert, oriented and cooperative. Patient is in no acute distress.  Skin: Skin is warm and dry. No rash noted.   Cardiovascular: Normal heart rate noted  Respiratory: Normal respiratory effort, no problems with respiration noted  Abdomen: Soft, gravid, appropriate for gestational age.  Pain/Pressure: Present     Pelvic: Cervical exam deferred        Extremities: Normal range of motion.     Mental Status: Normal mood and affect. Normal behavior. Normal judgment and thought content.   Assessment and Plan:  Pregnancy: [id number] at 1673w2d  1. Supervision of other normal pregnancy, antepartum - AFP, Serum, Open Spina Bifida - TSH, with thyroid panel  - Cardiology referral   Patient with palpitations and racing heart rate. She feels that it is mostly related to stress and anxiety, but it has happened outside of stressful events. She denies any cardiac history. Will check TSH today and cardiology referral .   Preterm labor symptoms and general obstetric precautions including but not limited to vaginal bleeding, contractions, leaking of  fluid and fetal movement were reviewed in detail with the patient. Please refer to After Visit Summary for other counseling recommendations.  Return in about 4 weeks (around 12/17/2017).  Future Appointments  Date Time Provider Department Center  12/10/2017  4:00 PM [facility]-MFC US 3 WH-MFCUS MFC-US  01/02/2018 11:20 AM Corky CraftsVaranasi, Jayadeep S, MD [facility]    Thressa ShellerHeather Hogan, CNM

## 2017-11-19 NOTE — Patient Instructions (Addendum)
AREA PEDIATRIC/FAMILY PRACTICE PHYSICIANS  [facility] [address], [address] [place], [place]  [address] Phone - [phone]   Fax - [phone]  [facility] [address] [place], [place] [address] Phone - [phone]   Fax - [phone]  [facility] AM[facility] [address] [place], [place]  [address] Phone - [phone]   Fax - [phone]  [facility] [address] [place], [place]  [address] Phone - [phone]   Fax - [phone]  [facility] [address] [place], [place]  [address] Phone - [phone]   Fax - [phone]  [facility] [address], [address] [place], [place]  [address] Phone - [phone]   Fax - [phone]  [facility] [address], [address] [place], [place]  [address] Phone - [phone]   Fax - [phone]  [facility] [address], [address] [place], [place]  [address] Phone - [phone]   Fax - [phone]  [facility] [address] [place], [place]  [address] Phone - [phone]   Fax - [phone] [facility] [address] [place], [place]  [address] Phone - [phone]   Fax - [phone]  [facility] [address] [place], [place]  [address] Phone - [phone]   Fax - [phone]  [facility] [address] [place], [place]  [address] Phone - [phone]   Fax - [phone]  [facility] [address], [address] [place], [place]  [address] Phone - [phone]   Fax - [phone]  SHILPA GOSRANI [address] [place], [place]  [address] Phone - [phone]  [facility] [address] [place], [place]  [address] Phone - [phone]   Fax - [phone]  [facility] [address] [place], [place]  [address] Phone - [phone]   Fax - [phone]  [facility] [address] [place], [place]  [address] Phone - [phone]   Fax - [phone]  [facility] [address] [place], [place]  [address] Phone - [phone]   Fax - [phone]   [facility] [address] [place], [place]  [address] Phone - [phone]   Fax - [phone]  [facility] [address] [place], [place]  [address] Phone - [phone]   Fax - [phone]  DAVID RUBIN [address] [place], [place]  [address] Phone - [phone]   Fax - [phone]  [facility] [address] [place], [place]  [address] Phone - [phone]   Fax - [phone]  [facility] [address] , [place]  [address] Phone - [phone]   Fax - [phone] [facility] [address] [place], [place]  [address] Phone - [phone]   Fax - [phone]  [facility] [address] [place], [place]  [address] Phone - [phone]   Fax - [phone]  [facility] [address] [place], [place]  [address] Phone - [phone]   Fax - [phone]  [facility] [place] Charlene Flemming MD [address]  [place] [address] Phone [phone]  Fax [phone]  Childbirth Education Options: [facility] Classes:  Childbirth education classes can help you   get ready for a positive parenting experience. You can also meet other expectant parents and get free stuff for your baby. Each class runs for five weeks on the same night and costs $45 for the mother-to-be and her support person. Medicaid covers the cost if you are eligible. Call [phone] to register. [facility] Childbirth Education:  [phone] or 5174845873 or  [email or web address]  Baby & Me Class: Discuss newborn & infant parenting and family adjustment issues with other new mothers in a relaxed environment. Each week brings a new speaker or baby-centered activity. We encourage new mothers to join [place] every Thursday at 11:00am. Babies birth until crawling. No registration or fee. Daddy [employer]: This course offers Dads-to-be the tools and knowledge needed to feel confident on their journey to becoming new fathers. Experienced dads, who have been trained as coaches, teach dads-to-be how to hold, comfort, diaper, swaddle and play with their infant while being able to support the new mom as well. A class for men taught by men. $25/dad Big Brother/Big Sister: Let your children share in the joy of a new brother or sister in this special class designed just for them. Class includes discussion about how families care for babies: swaddling, holding, diapering, safety as well as how they can be helpful in their new role. This class is designed for children ages 26 to 31, but any age is welcome. Please register each child individually. $5/child  Mom Talk: This mom-led group offers support and connection to mothers as they journey through the adjustments and struggles of that sometimes overwhelming first year after the birth of a child. Tuesdays at 10:00am and Thursdays at 6:00pm. Babies welcome. No registration or fee. Breastfeeding Support Group: This group is a mother-to-mother support circle where moms have the opportunity to share their breastfeeding experiences. A Lactation Consultant is present for questions and concerns. Meets each Tuesday at 11:00am. No fee or registration. Breastfeeding Your Baby: Learn what to expect in the first days of breastfeeding your newborn.  This class will help you feel more confident with the skills needed to begin your breastfeeding experience. Many new mothers are concerned about breastfeeding after leaving the hospital. This class  will also address the most common fears and challenges about breastfeeding during the first few weeks, months and beyond. (call for fee) Comfort Techniques and Tour: This 2 hour interactive class will provide you the opportunity to learn & practice hands-on techniques that can help relieve some of the discomfort of labor and encourage your baby to rotate toward the best position for birth. You and your partner will be able to try a variety of labor positions with birth balls and rebozos as well as practice breathing, relaxation, and visualization techniques. A tour of the [facility] is included with this class. $20 per registrant and support person Childbirth Class- Weekend Option: This class is a Weekend version of our Birth & Baby series. It is designed for parents who have a difficult time fitting several weeks of classes into their schedule. It covers the care of your newborn and the basics of labor and childbirth. It also includes a [place] [facility] and lunch. The class is held two consecutive days: beginning on Friday evening from 6:30 - 8:30 p.m. and the next day, Saturday from 9 a.m. - 4 p.m. (call for fee) Doren Custard Class: Interested in a waterbirth?  This informational class will help you discover whether waterbirth is the right fit for you.  Education about waterbirth itself, supplies you would need and how to assemble your support team is what you can expect from this class. Some obstetrical practices require this class in order to pursue a waterbirth. (Not all obstetrical practices offer waterbirth-check with your healthcare provider.) Register only the expectant mom, but you are encouraged to bring your partner to class! Required if planning waterbirth, no fee. Infant/Child CPR: Parents, grandparents, babysitters, and friends learn Cardio-Pulmonary Resuscitation skills for infants and children. You will also learn how to treat both conscious  and unconscious choking in infants and children. This Family & Friends program does not offer certification. Register each participant individually to ensure that enough mannequins are available. (Call for fee) Grandparent Love: Expecting a grandbaby? This class is for you! Learn about the latest infant care and safety recommendations and ways to support your own child as he or she transitions into the parenting role. Taught by Registered Nurses who are childbirth instructors, but most importantly...they are grandmothers too! $10/person. Childbirth Class- Natural Childbirth: This series of 5 weekly classes is for expectant parents who want to learn and practice natural methods of coping with the process of labor and childbirth. Relaxation, breathing, massage, visualization, role of the partner, and helpful positioning are highlighted. Participants learn how to be confident in their body's ability to give birth. This class will empower and help parents make informed decisions about their own care. Includes discussion that will help new parents transition into the immediate postpartum period. [facility] is included. We suggest taking this class between 25-32 weeks, but it's only a recommendation. $75 per registrant and one support person or $30 Medicaid. Childbirth Class- 3 week Series: This option of 3 weekly classes helps you and your labor partner prepare for childbirth. Newborn care, labor & birth, cesarean birth, pain management, and comfort techniques are discussed and a [place] [facility] is included. The class meets at the same time, on the same day of the week for 3 consecutive weeks beginning with the starting date you choose. $60 for registrant and one support person.  Marvelous Multiples: Expecting twins, triplets, or more? This class covers the differences in labor, birth, parenting, and breastfeeding issues that face multiples' parents.  NICU tour is included. Led by a Certified Childbirth Educator who is the mother of twins. No fee. Caring for Baby: This class is for expectant and adoptive parents who want to learn and practice the most up-to-date newborn care for their babies. Focus is on birth through the first six weeks of life. Topics include feeding, bathing, diapering, crying, umbilical cord care, circumcision care and safe sleep. Parents learn to recognize symptoms of illness and when to call the pediatrician. Register only the mom-to-be and your partner or support person can plan to come with you! $10 per registrant and support person Childbirth Class- online option: This online class offers you the freedom to complete a Birth and Baby series in the comfort of your own home. The flexibility of this option allows you to review sections at your own pace, at times convenient to you and your support people. It includes additional video information, animations, quizzes, and extended activities. Get organized with helpful eClass tools, checklists, and trackers. Once you register online for the class, you will receive an email within a few days to accept the invitation and begin the class when the time is right for you. The content will be available to you for 60 days. $  60 for 60 days of online access for you and your support people.  Local Doulas: Natural Baby Doulas [email or web address] Tel: [phone] [email or web address] [facility] [phone] [email or web address] The Labor Ladies  (also do waterbirth tub rental) [phone] [email or web address] [facility] [phone] [email or web address] Sacred Rhythms  [phone] [email or web address] [place] [employer] (PADA) [email or web address] http://www.padanc.org/index.htm [place] Birth and Baby   [email or web address] Considering Waterbirth? Guide for patients at [facility]  Why consider waterbirth?  . Gentle birth for babies . Less pain medicine used in labor . May allow for passive descent/less pushing . May reduce perineal tears  . More mobility and instinctive maternal position changes . Increased maternal relaxation . Reduced blood pressure in labor  Is waterbirth safe? What are the risks of infection, drowning or other complications?  . Infection: o Very low risk (3.7 % for tub vs 4.8% for bed) o 7 in 8000 waterbirths with documented infection o Poorly cleaned equipment most common cause o Slightly lower group B strep transmission rate  . Drowning o Maternal:  - Very low risk   - Related to seizures or fainting o Newborn:  - Very low risk. No evidence of increased risk of respiratory problems in multiple large studies - Physiological protection from breathing under water - Avoid underwater birth if there are any fetal complications - Once baby's head is out of the water, keep it out.  . Birth complication o Some reports of cord trauma, but risk decreased by bringing baby to surface gradually o No evidence of increased risk of shoulder dystocia. Mothers can usually change positions faster in water than in a bed, possibly aiding the maneuvers to free the shoulder.   You must attend a Waterbirth class at [facility]  3rd Wednesday of every month from 7-9pm  Free  Register by calling [phone] or online at [email or web address]  Bring us the certificate from the class to your prenatal appointment  Meet with a midwife at 36 weeks to see if you can still plan a waterbirth and to sign the consent.   Purchase or rent the following supplies:   Water Birth Pool (Birth Pool in a Box or [place] for instance)  (Tubs start ~$125)  Single-use disposable tub liner designed for your brand of tub  New garden hose labeled  "lead-free", "suitable for drinking water",  Electric drain pump to remove water (We recommend 792 gallon per hour or greater pump.)   Separate garden hose to remove the dirty water  Fish net  Bathing suit top (optional)  Long-handled mirror (optional)  Places to purchase or rent supplies  Yourwaterbirth.com for tub purchases and supplies  Waterbirthsolutions.com for tub purchases and supplies  The Labor Ladies (www.[email or web address]) $275 for tub rental/set-up & take down/kit   [place] [facility] [employer] (http://www.padanc.org/MeetUs.htm) Information regarding doulas (labor support) who provide pool rentals  Our practice has a Birth Pool in a Box tub at the hospital that you may borrow on a first-come-first-served basis. It is your responsibility to to set up, clean and break down the tub. We cannot guarantee the availability of this tub in advance. You are responsible for bringing all accessories listed above. If you do not have all necessary supplies you cannot have a waterbirth.    Things that would prevent you from having a waterbirth:  Premature, <37wks  Previous cesarean birth  Presence of thick meconium-stained fluid  Multiple gestation (Twins, triplets, etc.)  Uncontrolled diabetes or gestational diabetes requiring medication  Hypertension requiring medication   or diagnosis of pre-eclampsia  Heavy vaginal bleeding  Non-reassuring fetal heart rate  Active infection (MRSA, etc.). Group B Strep is NOT a contraindication for  waterbirth.  If your labor has to be induced and induction method requires continuous  monitoring of the baby's heart rate  Other risks/issues identified by your obstetrical provider  Please remember that birth is unpredictable. Under certain unforeseeable circumstances your provider may advise against giving birth in the tub. These decisions will be made on a case-by-case basis and with the safety of you and your baby as our highest  priority.     Deciding about Circumcision in Baby Boys  (The Basics)  What is circumcision?  Circumcision is a surgery that removes the skin that covers the tip of the penis, called the "foreskin" Circumcision is usually done when a boy is between 51 and 56 days old. In the [place], circumcision is common. In some other countries, fewer boys are circumcised. Circumcision is a common tradition in some religions.  Should I have my baby boy circumcised?  There is no easy answer. Circumcision has some benefits. But it also has risks. After talking with your doctor, you will have to decide for yourself what is right for your family.  What are the benefits of circumcision?  Circumcised boys seem to have slightly lower rates of: ?Urinary tract infections ?Swelling of the opening at the tip of the penis Circumcised men seem to have slightly lower rates of: ?Urinary tract infections ?Swelling of the opening at the tip of the penis ?Penis cancer ?HIV and other infections that you catch during sex ?Cervical cancer in the women they have sex with Even so, in the [place], the risks of these problems are small - even in boys and men who have not been circumcised. Plus, boys and men who are not circumcised can reduce these extra risks by: ?Cleaning their penis well ?Using condoms during sex  What are the risks of circumcision?  Risks include: ?Bleeding or infection from the surgery ?Damage to or amputation of the penis ?A chance that the doctor will cut off too much or not enough of the foreskin ?A chance that sex won't feel as good later in life Only about 1 out of every 200 circumcisions leads to problems. There is also a chance that your health insurance won't pay for circumcision.  How is circumcision done in baby boys?  First, the baby gets medicine for pain relief. This might be a cream on the skin or a shot into the base of the penis. Next, the doctor cleans the baby's  penis well. Then he or she uses special tools to cut off the foreskin. Finally, the doctor wraps a bandage (called gauze) around the baby's penis. If you have your baby circumcised, his doctor or nurse will give you instructions on how to care for him after the surgery. It is important that you follow those instructions carefully.

## 2017-11-21 LAB — THYROID PANEL WITH TSH
Free Thyroxine Index: 1.4 (ref 1.2–4.9)
T3 Uptake Ratio: 12 % — ABNORMAL LOW (ref 24–39)
T4 TOTAL: 11.9 ug/dL (ref 4.5–12.0)
TSH: 1.32 u[IU]/mL (ref 0.450–4.500)

## 2017-11-21 LAB — AFP, SERUM, OPEN SPINA BIFIDA
AFP MoM: 0.98
AFP VALUE AFPOSL: 53.1 ng/mL
GEST. AGE ON COLLECTION DATE: 21.1 wk
Maternal Age At EDD: 27.4 yr
OSBR RISK 1 IN: 10000
Test Results:: NEGATIVE
Weight: 233 [lb_av]

## 2017-12-03 ENCOUNTER — Telehealth: Payer: 59 | Admitting: Family

## 2017-12-03 DIAGNOSIS — J302 Other seasonal allergic rhinitis: Secondary | ICD-10-CM

## 2017-12-03 MED ORDER — LEVOCETIRIZINE DIHYDROCHLORIDE 5 MG PO TABS: 5.0000 mg | ORAL_TABLET | Freq: Every evening | ORAL | 2 refills | Status: DC

## 2017-12-03 NOTE — Progress Notes (Signed)
E visit for Allergic Rhinitis We are sorry that you are not feeling well.  Her is how we plan to help!  Based on what you have shared with me it looks like you have Allergic Rhinitis.  Rhinitis is when a reaction occurs that causes nasal congestion, runny nose, sneezing, and itching.  Most types of rhinitis are caused by an inflammation and are associated with symptoms in the eyes ears or throat. There are several types of rhinitis.  The most common are acute rhinitis, which is usually caused by a viral illness, allergic or seasonal rhinitis, and nonallergic or year-round rhinitis.  Nasal allergies occur certain times of the year.  Allergic rhinitis is caused when allergens in the air trigger the release of histamine in the body.  Histamine causes itching, swelling, and fluid to build up in the fragile linings of the nasal passages, sinuses and eyelids.  An itchy nose and clear discharge are common.  I recommend the following over the counter treatments: Xyzal 5 mg take 1 tablet daily     HOME CARE:   You can use an over-the-counter saline nasal spray as needed  Avoid areas where there is heavy dust, mites, or molds  Stay indoors on windy days during the pollen season  Keep windows closed in home, at least in bedroom; use air conditioner.  Use high-efficiency house air filter  Keep windows closed in car, turn AC on re-circulate  Avoid playing out with dog during pollen season  GET HELP RIGHT AWAY IF:   If your symptoms do not improve within 10 days  You become short of breath  You develop yellow or green discharge from your nose for over 3 days  You have coughing fits  MAKE SURE YOU:   Understand these instructions  Will watch your condition  Will get help right away if you are not doing well or get worse  Thank you for choosing an e-visit. Your e-visit answers were reviewed by a board certified advanced clinical practitioner to complete your personal care plan.  Depending upon the condition, your plan could have included both over the counter or prescription medications. Please review your pharmacy choice. Be sure that the pharmacy you have chosen is open so that you can pick up your prescription now.  If there is a problem you may message your provider in MyChart to have the prescription routed to another pharmacy. Your safety is important to [place]. If you have drug allergies check your prescription carefully.  For the next 24 hours, you can use MyChart to ask questions about today's visit, request a non-urgent call back, or ask for a work or school excuse from your e-visit provider. You will get an email in the next two days asking about your experience. I hope that your e-visit has been valuable and will speed your recovery.

## 2017-12-09 ENCOUNTER — Telehealth: Payer: 59 | Admitting: Family

## 2017-12-09 DIAGNOSIS — N898 Other specified noninflammatory disorders of vagina: Secondary | ICD-10-CM

## 2017-12-09 NOTE — Progress Notes (Signed)
Based on what you shared with me it looks like you have a serious condition that should be evaluated in a face to face office visit.  NOTE: If you entered your credit card information for this eVisit, you will not be charged. You may see a "hold" on your card for the $30 but that hold will drop off and you will not have a charge processed.  Since you have previously been treated for this and your symptoms have not resolved and you are currently pregnant I would recommend you following up face-to-face for a visit.  If you are having a true medical emergency please call 911.  If you need an urgent face to face visit, [place] has four urgent care centers for your convenience.  If you need care fast and have a high deductible or no insurance consider:   [email or web address] to reserve your spot online an avoid wait times  [facility] [address], [address] [place], [place] [address] 8 am to 8 pm Monday-Friday 10 am to 4 pm Saturday-Sunday *Across the street from [employer]  [address] [place], [address] 8 am to 5 pm Monday-Friday * In the [facility] on the [facility]   The following sites will take your  insurance:  . [facility]  [phone] Get Driving Directions Find a Provider at this Location  [address] [place], [place] [address] . 10 am to 8 pm Monday-Friday . 12 pm to 8 pm Saturday-Sunday   . [facility] at [facility]  [phone] Get Driving Directions Find a Provider at this Location  [address], [address] [place], [place] [address] . 8 am to 8 pm Monday-Friday . 9 am to 6 pm Saturday . 11 am to 6 pm Sunday   . [facility] at [facility]  [phone] Get Driving Directions  [address] [place], [place] [address] . 8 am to 8 pm Monday-Friday . 8 am to 4 pm Saturday-Sunday   Your e-visit answers were  reviewed by a board certified advanced clinical practitioner to complete your personal care plan.  Thank you for using e-Visits.

## 2017-12-10 ENCOUNTER — Ambulatory Visit (HOSPITAL_COMMUNITY): Payer: 59

## 2017-12-10 ENCOUNTER — Other Ambulatory Visit: Payer: Self-pay

## 2017-12-10 ENCOUNTER — Encounter (HOSPITAL_COMMUNITY): Payer: Self-pay | Admitting: Emergency Medicine

## 2017-12-10 ENCOUNTER — Emergency Department (HOSPITAL_COMMUNITY)
Admission: EM | Admit: 2017-12-10 | Discharge: 2017-12-10 | Disposition: A | Discharge status: Home / Self Care | Payer: 59 | Attending: Emergency Medicine | Admitting: Emergency Medicine

## 2017-12-10 DIAGNOSIS — Z79899 Other long term (current) drug therapy: Secondary | ICD-10-CM | POA: Diagnosis not present

## 2017-12-10 DIAGNOSIS — O2342 Unspecified infection of urinary tract in pregnancy, second trimester: Secondary | ICD-10-CM | POA: Insufficient documentation

## 2017-12-10 DIAGNOSIS — N390 Urinary tract infection, site not specified: Secondary | ICD-10-CM

## 2017-12-10 DIAGNOSIS — Z87891 Personal history of nicotine dependence: Secondary | ICD-10-CM | POA: Diagnosis not present

## 2017-12-10 DIAGNOSIS — Z3A24 24 weeks gestation of pregnancy: Secondary | ICD-10-CM | POA: Diagnosis not present

## 2017-12-10 DIAGNOSIS — R300 Dysuria: Secondary | ICD-10-CM | POA: Diagnosis present

## 2017-12-10 LAB — URINALYSIS, ROUTINE W REFLEX MICROSCOPIC
Bilirubin Urine: NEGATIVE
Glucose, UA: NEGATIVE mg/dL
Hgb urine dipstick: NEGATIVE
Ketones, ur: NEGATIVE mg/dL
Nitrite: NEGATIVE
Protein, ur: 30 mg/dL — AB
Specific Gravity, Urine: 1.023 (ref 1.005–1.030)
pH: 6 (ref 5.0–8.0)

## 2017-12-10 LAB — CBC WITH DIFFERENTIAL/PLATELET
Basophils Absolute: 0 10*3/uL (ref 0.0–0.1)
Basophils Relative: 0 %
Eosinophils Absolute: 0.1 10*3/uL (ref 0.0–0.7)
Eosinophils Relative: 1 %
HEMATOCRIT: 34.6 % — AB (ref 36.0–46.0)
Hemoglobin: 11.1 g/dL — ABNORMAL LOW (ref 12.0–15.0)
LYMPHS PCT: 16 %
Lymphs Abs: 1.4 10*3/uL (ref 0.7–4.0)
MCH: 28.6 pg (ref 26.0–34.0)
MCHC: 32.1 g/dL (ref 30.0–36.0)
MCV: 89.2 fL (ref 78.0–100.0)
MONO ABS: 0.3 10*3/uL (ref 0.1–1.0)
Monocytes Relative: 3 %
NEUTROS ABS: 6.6 10*3/uL (ref 1.7–7.7)
Neutrophils Relative %: 80 %
Platelets: 341 10*3/uL (ref 150–400)
RBC: 3.88 MIL/uL (ref 3.87–5.11)
RDW: 15 % (ref 11.5–15.5)
WBC: 8.3 10*3/uL (ref 4.0–10.5)

## 2017-12-10 LAB — COMPREHENSIVE METABOLIC PANEL
ALT: 12 U/L (ref 0–44)
AST: 14 U/L — ABNORMAL LOW (ref 15–41)
Albumin: 2.7 g/dL — ABNORMAL LOW (ref 3.5–5.0)
Alkaline Phosphatase: 84 U/L (ref 38–126)
Anion gap: 7 (ref 5–15)
BUN: 7 mg/dL (ref 6–20)
CO2: 25 mmol/L (ref 22–32)
Calcium: 8.9 mg/dL (ref 8.9–10.3)
Chloride: 108 mmol/L (ref 98–111)
Creatinine, Ser: 0.48 mg/dL (ref 0.44–1.00)
GFR calc Af Amer: >60 mL/min (ref >60)
GFR calc non Af Amer: >60 mL/min (ref >60)
Glucose, Bld: 125 mg/dL — ABNORMAL HIGH (ref 70–99)
Potassium: 3.3 mmol/L — ABNORMAL LOW (ref 3.5–5.1)
Sodium: 140 mmol/L (ref 135–145)
Total Bilirubin: 0.4 mg/dL (ref 0.3–1.2)
Total Protein: 6.8 g/dL (ref 6.5–8.1)

## 2017-12-10 LAB — WET PREP, GENITAL
Clue Cells Wet Prep HPF POC: NONE SEEN
Sperm: NONE SEEN
Trich, Wet Prep: NONE SEEN
Yeast Wet Prep HPF POC: NONE SEEN

## 2017-12-10 LAB — PROTEIN / CREATININE RATIO, URINE
Creatinine, Urine: 220.05 mg/dL
Protein Creatinine Ratio: 0.07 mg/mg{Cre} (ref 0.00–0.15)
Total Protein, Urine: 15 mg/dL

## 2017-12-10 MED ORDER — CEPHALEXIN 500 MG PO CAPS: 500.0000 mg | ORAL_CAPSULE | Freq: Two times a day (BID) | ORAL | 0 refills | Status: AC

## 2017-12-10 NOTE — ED Provider Notes (Signed)
[facility]-EMERGENCY DEPT Provider Note   CSN: [id number] Arrival date & time: 12/10/17  1123     History   Chief Complaint Chief Complaint  Patient presents with  . Dysuria  . Vaginal Discharge    HPI Kelly Morales is a 27 y.o. female.  HPI  27 yo female G4P2012 at 24wk presents with concern for dysuria, pruritis, and vaginal discharge.  Reports symptoms present over the last week. Denies leaking of fluid, vaginal bleeding, abdominal pain. Reports she is feeling baby moving normally.   History reviewed. No pertinent past medical history.  Patient Active Problem List   Diagnosis Date Noted  . Supervision of low-risk pregnancy 09/18/2017  . Poor dentition 09/18/2017    History reviewed. No pertinent surgical history.   OB History    Gravida  5   Para  2   Term  2   Preterm      AB  2   Living  2     SAB  1   TAB  1   Ectopic      Multiple      Live Births  2            Home Medications    Prior to Admission medications   Medication Sig Start Date End Date Taking? Authorizing Provider  escitalopram (LEXAPRO) 20 MG tablet Take 10-20 mg by mouth daily.    Yes [provider]  Prenatal Vit-Fe Fumarate-FA (MULTIVITAMIN-PRENATAL) 27-0.8 MG TABS tablet Take 1 tablet by mouth daily at 12 noon.   Yes [provider]  butalbital-acetaminophen-caffeine (FIORICET, ESGIC) 50-325-40 MG tablet Take 1-2 tablets by mouth every 6 (six) hours as needed for headache. Patient not taking: Reported on 12/10/2017 10/22/17 10/22/18  Thressa Sheller D, CNM  cephALEXin (KEFLEX) 500 MG capsule Take 1 capsule (500 mg total) by mouth 2 (two) times daily for 7 days. 12/10/17 12/17/17  Alvira Monday, MD  levocetirizine (XYZAL) 5 MG tablet Take 1 tablet (5 mg total) by mouth every evening. Patient not taking: Reported on 12/10/2017 12/03/17   Eulis Foster, FNP  ondansetron (ZOFRAN ODT) 8 MG disintegrating tablet Take 1 tablet (8  mg total) by mouth every 8 (eight) hours as needed for nausea or vomiting. Patient not taking: Reported on 12/10/2017 09/18/17   Armando Reichert, CNM  promethazine (PHENERGAN) 25 MG tablet Take 0.5-1 tablets (12.5-25 mg total) by mouth every 6 (six) hours as needed for nausea or vomiting. Patient not taking: Reported on 12/10/2017 09/18/17   Armando Reichert, CNM    Family History Family History  Problem Relation Age of Onset  . Diabetes Mother   . Hypertension Mother   . Hypertension Maternal Grandmother     Social History Social History   Tobacco Use  . Smoking status: Former Smoker    Last attempt to quit: 2019    Years since quitting: 0.7  . Smokeless tobacco: Never Used  Substance Use Topics  . Alcohol use: Not Currently  . Drug use: Never     Allergies   Patient has no known allergies.   Review of Systems Review of Systems  Constitutional: Negative for fever.  HENT: Negative for sore throat.   Eyes: Negative for visual disturbance.  Respiratory: Negative for cough and shortness of breath.   Cardiovascular: Negative for chest pain.  Gastrointestinal: Negative for abdominal pain, nausea and vomiting.  Genitourinary: Positive for dysuria and vaginal discharge. Negative for difficulty urinating.  Musculoskeletal: Negative for  back pain and neck pain.  Skin: Negative for rash.  Neurological: Negative for syncope and headaches.     Physical Exam Updated Vital Signs BP 110/78   Pulse 77   Temp 98.6 F (37 C) (Oral)   Resp 16   LMP 06/30/2017 (Exact Date)   SpO2 98%   Physical Exam  Constitutional: She is oriented to person, place, and time. She appears well-developed and well-nourished. No distress.  HENT:  Head: Normocephalic and atraumatic.  Eyes: Conjunctivae and EOM are normal.  Neck: Normal range of motion.  Cardiovascular: Normal rate, regular rhythm, normal heart sounds and intact distal pulses. Exam reveals no gallop and no friction rub.  No murmur  heard. Pulmonary/Chest: Effort normal and breath sounds normal. No respiratory distress. She has no wheezes. She has no rales.  Abdominal: Soft. She exhibits no distension. There is no tenderness. There is no guarding.  Gravid   Genitourinary: Vaginal discharge found.  Genitourinary Comments: Cervix closed No pooling of fluid  Musculoskeletal: She exhibits no edema or tenderness.  Neurological: She is alert and oriented to person, place, and time.  Skin: Skin is warm and dry. No rash noted. She is not diaphoretic. No erythema.  Nursing note and vitals reviewed.    ED Treatments / Results  Labs (all labs ordered are listed, but only abnormal results are displayed) Labs Reviewed  WET PREP, GENITAL - Abnormal; Notable for the following components:      Result Value   WBC, Wet Prep HPF POC MANY (*)    All other components within normal limits  URINALYSIS, ROUTINE W REFLEX MICROSCOPIC - Abnormal; Notable for the following components:   APPearance HAZY (*)    Protein, ur 30 (*)    Leukocytes, UA MODERATE (*)    Bacteria, UA RARE (*)    All other components within normal limits  CBC WITH DIFFERENTIAL/PLATELET - Abnormal; Notable for the following components:   Hemoglobin 11.1 (*)    HCT 34.6 (*)    All other components within normal limits  COMPREHENSIVE METABOLIC PANEL - Abnormal; Notable for the following components:   Potassium 3.3 (*)    Glucose, Bld 125 (*)    Albumin 2.7 (*)    AST 14 (*)    All other components within normal limits  PROTEIN / CREATININE RATIO, URINE  GC/CHLAMYDIA PROBE AMP ([place]) NOT AT [facility]    EKG None  Radiology No results found.  Procedures Procedures (including critical care time)  Medications Ordered in ED Medications - No data to display   Initial Impression / Assessment and Plan / ED Course  I have reviewed the triage vital signs and the nursing notes.  Pertinent labs & imaging results that were available during my care of the  patient were reviewed by me and considered in my medical decision making (see chart for details).    27 yo female G4P2012 at 24wk presents with concern for dysuria, pruritis, and vaginal discharge.  Initial BP 150 systolic, no other symptoms, CBC< CMP, no elevated protein creatinine ratio.  Discussed with OB who will also follow up on results.    Pt reports rupture of membranes in past pregnancy--no pooling of fluid at this time on exam, overall history and exam are consistent with discharge, not rupture, however discussed with OB and pt and if she has other concerns and desire for r/o rupture she may present to [facility] and patient agrees and has low suspicion at this time.  She reports  pruritis, no rash on exam, vaginitis may be allergic or she may have pruritis related to shaving.  GC/Chl pending. No sign of yeast/trichomonas or BV on wet prep.    Given dysuria present with moderate leukocytes, will treat for UTI with keflex.  Recommend close follow up with OBGYN. Patient discharged in stable condition with understanding of reasons to return.   Final Clinical Impressions(s) / ED Diagnoses   Final diagnoses:  Lower urinary tract infectious disease    ED Discharge Orders         Ordered    cephALEXin (KEFLEX) 500 MG capsule  2 times daily     12/10/17 1554           Alvira Monday, MD 12/10/17 2125

## 2017-12-10 NOTE — ED Triage Notes (Signed)
Pt complaint of vaginal discharge, odor, and burning with urination; recently treatment for same. Pt is 6 months pregnant and verbalizes feeling baby movement per normal.

## 2017-12-11 LAB — GC/CHLAMYDIA PROBE AMP (~~LOC~~) NOT AT ARMC
CHLAMYDIA, DNA PROBE: NEGATIVE
Neisseria Gonorrhea: NEGATIVE

## 2017-12-13 ENCOUNTER — Ambulatory Visit (HOSPITAL_COMMUNITY)
Admit: 2017-12-13 | Discharge: 2017-12-13 | Disposition: A | Discharge status: Home / Self Care | Payer: 59 | Attending: Advanced Practice Midwife | Admitting: Advanced Practice Midwife

## 2017-12-13 DIAGNOSIS — Z362 Encounter for other antenatal screening follow-up: Secondary | ICD-10-CM | POA: Diagnosis not present

## 2017-12-13 DIAGNOSIS — Z3A24 24 weeks gestation of pregnancy: Secondary | ICD-10-CM | POA: Insufficient documentation

## 2017-12-13 DIAGNOSIS — Z3482 Encounter for supervision of other normal pregnancy, second trimester: Secondary | ICD-10-CM | POA: Insufficient documentation

## 2017-12-14 ENCOUNTER — Other Ambulatory Visit (HOSPITAL_COMMUNITY): Payer: Self-pay | Admitting: *Deleted

## 2017-12-14 DIAGNOSIS — Z362 Encounter for other antenatal screening follow-up: Secondary | ICD-10-CM

## 2017-12-17 ENCOUNTER — Encounter: Payer: Self-pay | Admitting: Advanced Practice Midwife

## 2017-12-17 ENCOUNTER — Ambulatory Visit (INDEPENDENT_AMBULATORY_CARE_PROVIDER_SITE_OTHER): Payer: 59 | Admitting: Advanced Practice Midwife

## 2017-12-17 DIAGNOSIS — Z23 Encounter for immunization: Secondary | ICD-10-CM

## 2017-12-17 DIAGNOSIS — Z3482 Encounter for supervision of other normal pregnancy, second trimester: Secondary | ICD-10-CM

## 2017-12-17 DIAGNOSIS — Z3490 Encounter for supervision of normal pregnancy, unspecified, unspecified trimester: Secondary | ICD-10-CM

## 2017-12-17 MED ORDER — COMFORT FIT MATERNITY SUPP MED MISC: 1.0000 | Freq: Every day | 0 refills | Status: DC | PRN

## 2017-12-17 MED ORDER — ESCITALOPRAM OXALATE 20 MG PO TABS: 20.0000 mg | ORAL_TABLET | Freq: Every day | ORAL | 3 refills | Status: DC

## 2017-12-17 NOTE — Patient Instructions (Addendum)
Places to have your son circumcised:    [facility] [phone] while you are in hospital  [facility] [phone] $244 by 4 wks  [facility] [phone] $175 by 2 wks  [facility] [phone] $250 by 7 days [facility] [phone] $269 by 4 wks  These prices sometimes change but are roughly what you can expect to pay. Please call and confirm pricing.   Circumcision is considered an elective/non-medically necessary procedure. There are many reasons parents decide to have their sons circumsized. During the first year of life circumcised males have a reduced risk of urinary tract infections but after this year the rates between circumcised males and uncircumcised males are the same.  It is safe to have your son circumcised outside of the hospital and the places above perform them regularly.   Deciding about Circumcision in Baby Boys  (Up-to-date The Basics)  What is circumcision?  Circumcision is a surgery that removes the skin that covers the tip of the penis, called the "foreskin" Circumcision is usually done when a boy is between 65 and 3 days old. In the [place], circumcision is common. In some other countries, fewer boys are circumcised. Circumcision is a common tradition in some religions.  Should I have my baby boy circumcised?  There is no easy answer. Circumcision has some benefits. But it also has risks. After talking with your doctor, you will have to decide for yourself what is right for your family.  What are the benefits of circumcision?  Circumcised boys seem to have slightly lower rates of: ?Urinary tract infections ?Swelling of the opening at the tip of the penis Circumcised men seem to have slightly lower rates of: ?Urinary tract infections ?Swelling of the opening at the tip of the penis ?Penis  cancer ?HIV and other infections that you catch during sex ?Cervical cancer in the women they have sex with Even so, in the [place], the risks of these problems are small - even in boys and men who have not been circumcised. Plus, boys and men who are not circumcised can reduce these extra risks by: ?Cleaning their penis well ?Using condoms during sex  What are the risks of circumcision?  Risks include: ?Bleeding or infection from the surgery ?Damage to or amputation of the penis ?A chance that the doctor will cut off too much or not enough of the foreskin ?A chance that sex won't feel as good later in life Only about 1 out of every 200 circumcisions leads to problems. There is also a chance that your health insurance won't pay for circumcision.  How is circumcision done in baby boys?  First, the baby gets medicine for pain relief. This might be a cream on the skin or a shot into the base of the penis. Next, the doctor cleans the baby's penis well. Then he or she uses special tools to cut off the foreskin. Finally, the doctor wraps a bandage (called gauze) around the baby's penis. If you have your baby circumcised, his doctor or nurse will give you instructions on how to care for him after the surgery. It is important that you follow those instructions carefully.    PREGNANCY SUPPORT BELT: You are not alone, Seventy-five percent of women have some sort of abdominal or back pain at some point in their pregnancy. Your baby is growing at a fast pace, which means that your whole body is rapidly trying to adjust to the changes. As your uterus grows, your back may start feeling a  bit under stress and this can result in back or abdominal pain that can go from mild, and therefore bearable, to severe pains that will not allow you to sit or lay down comfortably, When it comes to dealing with pregnancy-related pains and cramps, some pregnant women usually prefer natural remedies, which the market  is filled with nowadays. For example, wearing a pregnancy support belt can help ease and lessen your discomfort and pain. WHAT ARE THE BENEFITS OF WEARING A PREGNANCY SUPPORT BELT? A pregnancy support belt provides support to the lower portion of the belly taking some of the weight of the growing uterus and distributing to the other parts of your body. It is designed make you comfortable and gives you extra support. Over the years, the pregnancy apparel market has been studying the needs and wants of pregnant women and they have come up with the most comfortable pregnancy support belts that woman could ever ask for. In fact, you will no longer have to wear a stretched-out or bulky pregnancy belt that is visible underneath your clothes and makes you feel even more uncomfortable. Nowadays, a pregnancy support belt is made of comfortable and stretchy materials that will not irritate your skin but will actually make you feel at ease and you will not even notice you are wearing it. They are easy to put on and adjust during the day and can be worn at night for additional support.  BENEFITS: . Relives Back pain . Relieves Abdominal Muscle and Leg Pain . Stabilizes the Pelvic Ring . Offers a Cushioned Abdominal Lift Pad . Relieves pressure on the Sciatic Nerve Within Minutes WHERE TO GET YOUR PREGNANCY BELT: [employer] [phone] @2301  [address] [place], [place] [address]

## 2017-12-17 NOTE — Progress Notes (Signed)
   PRENATAL VISIT NOTE  Subjective:  Kelly Morales is a 27 y.o. [id number] at [redacted]w[redacted]d being seen today for ongoing prenatal care.  She is currently monitored for the following issues for this low-risk pregnancy and has Supervision of low-risk pregnancy and Poor dentition on their problem list.  Patient reports no complaints.  Contractions: Irritability. Vag. Bleeding: None.  Movement: Present. Denies leaking of fluid.   The following portions of the patient's history were reviewed and updated as appropriate: allergies, current medications, past family history, past medical history, past social history, past surgical history and problem list. Problem list updated.  Objective:   Vitals:   12/17/17 1636  BP: 125/66  Pulse: 66  Weight: 237 lb 12.8 oz (107.9 kg)    Fetal Status: Fetal Heart Rate (bpm): 145 Fundal Height: 24 cm Movement: Present     General:  Alert, oriented and cooperative. Patient is in no acute distress.  Skin: Skin is warm and dry. No rash noted.   Cardiovascular: Normal heart rate noted  Respiratory: Normal respiratory effort, no problems with respiration noted  Abdomen: Soft, gravid, appropriate for gestational age.  Pain/Pressure: Present     Pelvic: Cervical exam deferred        Extremities: Normal range of motion.  Edema: Trace  Mental Status: Normal mood and affect. Normal behavior. Normal judgment and thought content.   Assessment and Plan:  Pregnancy: A5W0981 at [redacted]w[redacted]d  1. Encounter for supervision of low-risk pregnancy, antepartum - routine care - Flu vax today  - rx for pregnancy support belt provided, and instructions on where to pick up - Glucose Tolerance, 2 Hours w/1 Hour; Future - CBC; Future - HIV Antibody (routine testing w rflx); Future - RPR; Future  Preterm labor symptoms and general obstetric precautions including but not limited to vaginal bleeding, contractions, leaking of fluid and fetal movement were reviewed in detail with the  patient. Please refer to After Visit Summary for other counseling recommendations.  Return in about 4 weeks (around 01/14/2018) for needs 28 week labs and GTT at next visit .  Future Appointments  Date Time Provider Department Center  01/02/2018 11:20 AM Corky Crafts, MD [facility]  01/10/2018  3:30 PM [facility] [place] 1 WH-MFCUS [facility]  01/14/2018  8:20 AM [facility]  01/14/2018 10:15 AM Tamera Stands, DO [facility]    Thressa Sheller, CNM

## 2018-01-02 ENCOUNTER — Ambulatory Visit: Payer: 59 | Admitting: Interventional Cardiology

## 2018-01-03 ENCOUNTER — Encounter: Payer: Self-pay | Admitting: Interventional Cardiology

## 2018-01-10 ENCOUNTER — Ambulatory Visit (HOSPITAL_COMMUNITY): Payer: Self-pay

## 2018-01-11 ENCOUNTER — Other Ambulatory Visit: Payer: Self-pay | Admitting: General Practice

## 2018-01-11 DIAGNOSIS — Z3493 Encounter for supervision of normal pregnancy, unspecified, third trimester: Secondary | ICD-10-CM

## 2018-01-14 ENCOUNTER — Ambulatory Visit (INDEPENDENT_AMBULATORY_CARE_PROVIDER_SITE_OTHER): Payer: 59 | Admitting: Family Medicine

## 2018-01-14 ENCOUNTER — Other Ambulatory Visit: Payer: 59

## 2018-01-14 VITALS — BP 111/65 | HR 87 | Wt 240.1 lb

## 2018-01-14 DIAGNOSIS — Z113 Encounter for screening for infections with a predominantly sexual mode of transmission: Secondary | ICD-10-CM

## 2018-01-14 DIAGNOSIS — B373 Candidiasis of vulva and vagina: Secondary | ICD-10-CM | POA: Diagnosis not present

## 2018-01-14 DIAGNOSIS — Z3490 Encounter for supervision of normal pregnancy, unspecified, unspecified trimester: Secondary | ICD-10-CM | POA: Diagnosis not present

## 2018-01-14 DIAGNOSIS — L299 Pruritus, unspecified: Secondary | ICD-10-CM

## 2018-01-14 DIAGNOSIS — N898 Other specified noninflammatory disorders of vagina: Secondary | ICD-10-CM | POA: Diagnosis not present

## 2018-01-14 DIAGNOSIS — Z3493 Encounter for supervision of normal pregnancy, unspecified, third trimester: Secondary | ICD-10-CM

## 2018-01-14 DIAGNOSIS — O24410 Gestational diabetes mellitus in pregnancy, diet controlled: Secondary | ICD-10-CM

## 2018-01-14 DIAGNOSIS — Z23 Encounter for immunization: Secondary | ICD-10-CM

## 2018-01-14 DIAGNOSIS — M5431 Sciatica, right side: Secondary | ICD-10-CM

## 2018-01-14 LAB — POCT URINALYSIS DIP (DEVICE)
Bilirubin Urine: NEGATIVE
Glucose, UA: 500 mg/dL — AB
HGB URINE DIPSTICK: NEGATIVE
Ketones, ur: NEGATIVE mg/dL
NITRITE: NEGATIVE
Protein, ur: NEGATIVE mg/dL
SPECIFIC GRAVITY, URINE: 1.025 (ref 1.005–1.030)
Urobilinogen, UA: 0.2 mg/dL (ref 0.0–1.0)
pH: 6.5 (ref 5.0–8.0)

## 2018-01-14 MED ORDER — COMFORT FIT MATERNITY SUPP MED MISC: 0 refills | Status: DC

## 2018-01-14 NOTE — Progress Notes (Signed)
   PRENATAL VISIT NOTE Subjective:  Kelly Morales is a 27 y.o. [id number] at [redacted]w[redacted]d being seen today for ongoing prenatal care.  She is currently monitored for the following issues for this low-risk pregnancy and has Supervision of low-risk pregnancy; Poor dentition; BMI 33.0-33.9,adult; Iron deficiency anemia; and Mixed anxiety and depressive disorder on their problem list.  Patient reports backache and vaginal irritation. C/o shooting pain down R leg with 5-8/10 pain in lower back. Contractions: Not present. Vag. Bleeding: None.  Movement: Present. Denies leaking of fluid.   The following portions of the patient's history were reviewed and updated as appropriate: allergies, current medications, past family history, past medical history, past social history, past surgical history and problem list. Problem list updated.  Objective:   Vitals:   01/14/18 1020  BP: 111/65  Pulse: 87  Weight: 240 lb 1.6 oz (108.9 kg)   Fetal Status: Fetal Heart Rate (bpm): 150 Fundal Height: 29 cm Movement: Present     General:  Alert, oriented and cooperative. Patient is in no acute distress.  Skin: Skin is warm and dry. No rash noted.   Cardiovascular: Normal heart rate noted  Respiratory: Normal respiratory effort, no problems with respiration noted  Abdomen: Soft, gravid, appropriate for gestational age.  Pain/Pressure: Present     Pelvic: Cervical exam performed      erythematous mildly excoriated cervix with mild milky white discharge.   Extremities: Normal range of motion.  Edema: Trace  Mental Status: Normal mood and affect. Normal behavior. Normal judgment and thought content.   Assessment and Plan:  Pregnancy: [id number] at [redacted]w[redacted]d  1. Encounter for supervision of low-risk pregnancy in third trimester - Tdap vaccine greater than or equal to 7yo IM - GTT 2 hr - CBC - HIV - RPR  - has repeat anatomy U/S this week because of limited cardiac views   2. Itching with irritation Mild  excoriations/erythema on cervix with mild milky white discharge from cervical os. Likely BV, however severe itching is more suggestive of candidal infection. Recently seen in ED and diagnosed with UTI but did not take antibiotics as no definitive urinary symptoms, no urine culture obtained at that time.  - Cervicovaginal ancillary only - Culture, OB Urine - Urinalysis, Routine w reflex microscopic  3. Sciatica of right side Patient with hx of lower back pain likely worsened with pregnancy. - Tylenol q6hrs scheduled for several days  - Ice or heat - Elastic Bandages & Supports (COMFORT FIT MATERNITY SUPP MED) MISC; Use as directed to help with sciatic pain.  Dispense: 1 each; Refill: 0  Preterm labor symptoms and general obstetric precautions including but not limited to vaginal bleeding, contractions, leaking of fluid and fetal movement were reviewed in detail with the patient. Please refer to After Visit Summary for other counseling recommendations.   Return in about 2 weeks (around 01/28/2018) for LROB.  Future Appointments  Date Time Provider Department Center  01/18/2018  2:30 PM [facility] [place] 5 WH-M[facility]US [facility]-US  01/28/2018  3:35 PM Armando Reichert, CNM [facility]  02/11/2018  3:35 PM Armando Reichert, CNM [facility]  02/25/2018  3:55 PM Armando Reichert, CNM [facility]   Tamera Stands, DO

## 2018-01-14 NOTE — Patient Instructions (Addendum)
You can try Tylenol for pain (two regular strength 650mg  or 1 extra-strength 500mg ) four times a day as needed   You can try heat or ice whichever helps more     PREGNANCY SUPPORT BELT: You are not alone, Seventy-five percent of women have some sort of abdominal or back pain at some point in their pregnancy. Your baby is growing at a fast pace, which means that your whole body is rapidly trying to adjust to the changes. As your uterus grows, your back may start feeling a bit under stress and this can result in back or abdominal pain that can go from mild, and therefore bearable, to severe pains that will not allow you to sit or lay down comfortably, When it comes to dealing with pregnancy-related pains and cramps, some pregnant women usually prefer natural remedies, which the market is filled with nowadays. For example, wearing a pregnancy support belt can help ease and lessen your discomfort and pain. WHAT ARE THE BENEFITS OF WEARING A PREGNANCY SUPPORT BELT? A pregnancy support belt provides support to the lower portion of the belly taking some of the weight of the growing uterus and distributing to the other parts of your body. It is designed make you comfortable and gives you extra support. Over the years, the pregnancy apparel market has been studying the needs and wants of pregnant women and they have come up with the most comfortable pregnancy support belts that woman could ever ask for. In fact, you will no longer have to wear a stretched-out or bulky pregnancy belt that is visible underneath your clothes and makes you feel even more uncomfortable. Nowadays, a pregnancy support belt is made of comfortable and stretchy materials that will not irritate your skin but will actually make you feel at ease and you will not even notice you are wearing it. They are easy to put on and adjust during the day and can be worn at night for additional support.  BENEFITS: . Relives Back pain . Relieves Abdominal  Muscle and Leg Pain . Stabilizes the Pelvic Ring . Offers a Cushioned Abdominal Lift Pad . Relieves pressure on the Sciatic Nerve Within Minutes WHERE TO GET YOUR PREGNANCY BELT: [employer] [phone] @[phone]  [address] [place], [place] [address]  Sciatica Rehab Ask your health care provider which exercises are safe for you. Do exercises exactly as told by your health care provider and adjust them as directed. It is normal to feel mild stretching, pulling, tightness, or discomfort as you do these exercises, but you should stop right away if you feel sudden pain or your pain gets worse.Do not begin these exercises until told by your health care provider. Stretching and range of motion exercises These exercises warm up your muscles and joints and improve the movement and flexibility of your hips and your back. These exercises also help to relieve pain, numbness, and tingling. Exercise A: Sciatic nerve glide 1. Sit in a chair with your head facing down toward your chest. Place your hands behind your back. Let your shoulders slump forward. 2. Slowly straighten one of your knees while you tilt your head back as if you are looking toward the ceiling. Only straighten your leg as far as you can without making your symptoms worse. 3. Hold for __________ seconds. 4. Slowly return to the starting position. 5. Repeat with your other leg. Exercise B: Knee to chest with hip adduction and internal rotation  1. Lie on your back on  a firm surface with both legs straight. 2. Bend one of your knees and move it up toward your chest until you feel a gentle stretch in your lower back and buttock. Then, move your knee toward the shoulder that is on the opposite side from your leg. ? Hold your leg in this position by holding onto the front of your knee. 3. Hold for __________ seconds. 4. Slowly return to the starting position. 5. Repeat with your other leg. These exercises build strength and  endurance in your back. Endurance is the ability to use your muscles for a long time, even after they get tired. Exercise D: Pelvic tilt 1. Lie on your back on a firm surface. Bend your knees and keep your feet flat. 2. Tense your abdominal muscles. Tip your pelvis up toward the ceiling and flatten your lower back into the floor. ? To help with this exercise, you may place a small towel under your lower back and try to push your back into the towel. 3. Hold for __________ seconds. 4. Let your muscles relax completely before you repeat this exercise. Repeat __________ times. Complete this exercise __________ times a day. Exercise E: Alternating arm and leg raises  1. Get on your hands and knees on a firm surface. If you are on a hard floor, you may want to use padding to cushion your knees, such as an exercise mat. 2. Line up your arms and legs. Your hands should be below your shoulders, and your knees should be below your hips. 3. Lift your left leg behind you. At the same time, raise your right arm and straighten it in front of you. ? Do not lift your leg higher than your hip. ? Do not lift your arm higher than your shoulder. ? Keep your abdominal and back muscles tight. ? Keep your hips facing the ground. ? Do not arch your back. ? Keep your balance carefully, and do not hold your breath. 4. Hold for __________ seconds. 5. Slowly return to the starting position and repeat with your right leg and your left arm. Posture and body mechanics  Body mechanics refers to the movements and positions of your body while you do your daily activities. Posture is part of body mechanics. Good posture and healthy body mechanics can help to relieve stress in your body's tissues and joints. Good posture means that your spine is in its natural S-curve position (your spine is neutral), your shoulders are pulled back slightly, and your head is not tipped forward. The following are general guidelines for applying  improved posture and body mechanics to your everyday activities. Standing   When standing, keep your spine neutral and your feet about hip-width apart. Keep a slight bend in your knees. Your ears, shoulders, and hips should line up.  When you do a task in which you stand in one place for a long time, place one foot up on a stable object that is 2-4 inches (5-10 cm) high, such as a footstool. This helps keep your spine neutral. Sitting   When sitting, keep your spine neutral and keep your feet flat on the floor. Use a footrest, if necessary, and keep your thighs parallel to the floor. Avoid rounding your shoulders, and avoid tilting your head forward.  When working at a desk or a computer, keep your desk at a height where your hands are slightly lower than your elbows. Slide your chair under your desk so you are close enough to maintain good  posture.  When working at a computer, place your monitor at a height where you are looking straight ahead and you do not have to tilt your head forward or downward to look at the screen. Resting   When lying down and resting, avoid positions that are most painful for you.  If you have pain with activities such as sitting, bending, stooping, or squatting (flexion-based activities), lie in a position in which your body does not bend very much. For example, avoid curling up on your side with your arms and knees near your chest (fetal position).  If you have pain with activities such as standing for a long time or reaching with your arms (extension-based activities), lie with your spine in a neutral position and bend your knees slightly. Try the following positions: ? Lying on your side with a pillow between your knees. ? Lying on your back with a pillow under your knees. Lifting   When lifting objects, keep your feet at least shoulder-width apart and tighten your abdominal muscles.  Bend your knees and hips and keep your spine neutral. It is important  to lift using the strength of your legs, not your back. Do not lock your knees straight out.  Always ask for help to lift heavy or awkward objects. This information is not intended to replace advice given to you by your health care provider. Make sure you discuss any questions you have with your health care provider. Document Released: 02/20/2005 Document Revised: 10/28/2015 Document Reviewed: 11/06/2014 Elsevier Interactive Patient Education  [employer].

## 2018-01-15 ENCOUNTER — Telehealth: Payer: Self-pay | Admitting: Family Medicine

## 2018-01-15 ENCOUNTER — Telehealth: Payer: Self-pay | Admitting: General Practice

## 2018-01-15 DIAGNOSIS — O24415 Gestational diabetes mellitus in pregnancy, controlled by oral hypoglycemic drugs: Secondary | ICD-10-CM | POA: Insufficient documentation

## 2018-01-15 HISTORY — DX: Gestational diabetes mellitus in pregnancy, controlled by oral hypoglycemic drugs: O24.415

## 2018-01-15 LAB — URINALYSIS, ROUTINE W REFLEX MICROSCOPIC
Bilirubin, UA: NEGATIVE
KETONES UA: NEGATIVE
NITRITE UA: NEGATIVE
Protein, UA: NEGATIVE
RBC, UA: NEGATIVE
Specific Gravity, UA: 1.027 (ref 1.005–1.030)
UUROB: 0.2 mg/dL (ref 0.2–1.0)
pH, UA: 6 (ref 5.0–7.5)

## 2018-01-15 LAB — MICROSCOPIC EXAMINATION: Casts: NONE SEEN /lpf

## 2018-01-15 LAB — CERVICOVAGINAL ANCILLARY ONLY
BACTERIAL VAGINITIS: NEGATIVE
CANDIDA VAGINITIS: POSITIVE — AB
Chlamydia: NEGATIVE
Neisseria Gonorrhea: NEGATIVE
Trichomonas: NEGATIVE

## 2018-01-15 LAB — CBC
Hematocrit: 31.9 % — ABNORMAL LOW (ref 34.0–46.6)
Hemoglobin: 9.9 g/dL — ABNORMAL LOW (ref 11.1–15.9)
MCH: 26.4 pg — ABNORMAL LOW (ref 26.6–33.0)
MCHC: 31 g/dL — AB (ref 31.5–35.7)
MCV: 85 fL (ref 79–97)
Platelets: 341 10*3/uL (ref 150–450)
RBC: 3.75 x10E6/uL — ABNORMAL LOW (ref 3.77–5.28)
RDW: 14.8 % (ref 12.3–15.4)
WBC: 8.9 10*3/uL (ref 3.4–10.8)

## 2018-01-15 LAB — GLUCOSE TOLERANCE, 2 HOURS W/ 1HR
GLUCOSE, 1 HOUR: 183 mg/dL — AB (ref 65–179)
GLUCOSE, FASTING: 95 mg/dL — AB (ref 65–91)
Glucose, 2 hour: 129 mg/dL (ref 65–152)

## 2018-01-15 LAB — HIV ANTIBODY (ROUTINE TESTING W REFLEX): HIV Screen 4th Generation wRfx: NONREACTIVE

## 2018-01-15 LAB — RPR: RPR: NONREACTIVE

## 2018-01-15 MED ORDER — LANCETS ULTRA THIN 30G MISC: 12 refills | Status: DC

## 2018-01-15 MED ORDER — FREESTYLE SYSTEM KIT: PACK | 0 refills | Status: DC

## 2018-01-15 MED ORDER — GLUCOSE BLOOD VI STRP: ORAL_STRIP | 12 refills | Status: DC

## 2018-01-15 NOTE — Telephone Encounter (Signed)
Patient called & left message requesting results. Attempted to call patient, no answer- left message stating we are trying to reach you with results, please check your mychart account for more information. Per chart review, patient has GDM.

## 2018-01-15 NOTE — Telephone Encounter (Signed)
Called patient regarding lab results. Called all three numbers listed in chart and left voicemail on mobile phone. Encouraged her to call back to clinic to have results reviewed. Patient with new diagnosis of gestational diabetes. Will send message to admin tool to schedule for DM education visit.   Kelly Morales. Earlene Plater, DO OB/GYN Fellow

## 2018-01-16 ENCOUNTER — Other Ambulatory Visit: Payer: Self-pay | Admitting: General Practice

## 2018-01-16 ENCOUNTER — Other Ambulatory Visit: Payer: Self-pay | Admitting: Family Medicine

## 2018-01-16 DIAGNOSIS — B379 Candidiasis, unspecified: Secondary | ICD-10-CM

## 2018-01-16 MED ORDER — TERCONAZOLE 0.4 % VA CREA: 1.0000 | TOPICAL_CREAM | Freq: Every day | VAGINAL | 0 refills | Status: DC

## 2018-01-18 ENCOUNTER — Ambulatory Visit (HOSPITAL_COMMUNITY)
Admission: RE | Admit: 2018-01-18 | Discharge: 2018-01-18 | Disposition: A | Discharge status: Home / Self Care | Payer: 59 | Source: Ambulatory Visit | Attending: Advanced Practice Midwife | Admitting: Advanced Practice Midwife

## 2018-01-18 ENCOUNTER — Encounter (HOSPITAL_COMMUNITY): Payer: Self-pay

## 2018-01-18 DIAGNOSIS — Z362 Encounter for other antenatal screening follow-up: Secondary | ICD-10-CM | POA: Insufficient documentation

## 2018-01-18 DIAGNOSIS — O24410 Gestational diabetes mellitus in pregnancy, diet controlled: Secondary | ICD-10-CM | POA: Insufficient documentation

## 2018-01-18 DIAGNOSIS — Z3A29 29 weeks gestation of pregnancy: Secondary | ICD-10-CM | POA: Insufficient documentation

## 2018-01-18 DIAGNOSIS — O0990 Supervision of high risk pregnancy, unspecified, unspecified trimester: Secondary | ICD-10-CM

## 2018-01-18 HISTORY — DX: Gestational diabetes mellitus in pregnancy, unspecified control: O24.419

## 2018-01-18 HISTORY — DX: Anemia, unspecified: D64.9

## 2018-01-18 LAB — URINE CULTURE, OB REFLEX

## 2018-01-18 LAB — CULTURE, OB URINE

## 2018-01-21 ENCOUNTER — Other Ambulatory Visit (HOSPITAL_COMMUNITY): Payer: Self-pay | Admitting: *Deleted

## 2018-01-21 ENCOUNTER — Other Ambulatory Visit: Payer: Self-pay | Admitting: Family Medicine

## 2018-01-21 DIAGNOSIS — O24410 Gestational diabetes mellitus in pregnancy, diet controlled: Secondary | ICD-10-CM

## 2018-01-21 MED ORDER — ESCITALOPRAM OXALATE 20 MG PO TABS: 20.0000 mg | ORAL_TABLET | Freq: Every day | ORAL | 3 refills | Status: DC

## 2018-01-22 ENCOUNTER — Encounter: Payer: Self-pay | Admitting: Family Medicine

## 2018-01-22 DIAGNOSIS — R8271 Bacteriuria: Secondary | ICD-10-CM | POA: Insufficient documentation

## 2018-01-24 ENCOUNTER — Encounter: Payer: 59 | Admitting: *Deleted

## 2018-01-24 ENCOUNTER — Encounter: Payer: 59 | Attending: Family Medicine | Admitting: *Deleted

## 2018-01-24 ENCOUNTER — Ambulatory Visit (INDEPENDENT_AMBULATORY_CARE_PROVIDER_SITE_OTHER): Payer: 59 | Admitting: *Deleted

## 2018-01-24 DIAGNOSIS — Z713 Dietary counseling and surveillance: Secondary | ICD-10-CM | POA: Insufficient documentation

## 2018-01-24 DIAGNOSIS — O24410 Gestational diabetes mellitus in pregnancy, diet controlled: Secondary | ICD-10-CM

## 2018-01-24 NOTE — Progress Notes (Signed)
  Patient was seen on 01/24/2018 for Gestational Diabetes self-management. EDD 04/01/2018. Patient states no history of GDM. 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 She also states she is living with her parents and does not have easy access to kitchen to prepare foods. Diet history obtained. Patient eats fair variety of all food groups but many fast food meals. Beverages include water, OJ, regular soda, frappe's and hot chocolate.  The following learning objectives were met by the patient :   States the definition of Gestational Diabetes  States why dietary management is important in controlling blood glucose  Describes the effects of carbohydrates on blood glucose levels  Demonstrates ability to create a balanced meal plan  Demonstrates carbohydrate counting   States when to check blood glucose levels  Demonstrates proper blood glucose monitoring techniques  States the effect of stress and exercise on blood glucose levels  States the importance of limiting caffeine and abstaining from alcohol and smoking  Plan:  Aim for 3 - 4 Carb Choices per meal (45-60 grams)  Aim for 1-2 Carbs per snack Begin reading food labels for Total Carbohydrate of foods If OK with your MD, consider  increasing your activity level by walking, Arm Chair Exercises or other activity daily as tolerated Begin checking BG before breakfast and 2 hours after first bite of breakfast, lunch and dinner as directed by MD  Bring Log Book/Sheet to every medical appointment   Baby Scripts:  Patient was introduced to [employer] and plans to use as record of BG electronically  Take medication if directed by MD  Patient already has a meter: Freestyle Lite  And is testing pre breakfast and 2 hours each meal as directed by MD Review of Log Book shows: most numbers in target range but several post meal too high  Patient instructed to monitor glucose levels: FBS: 60 - 95 mg/dl 2 hour: <120  mg/dl  Patient received the following handouts:  Nutrition Diabetes and Pregnancy  Carbohydrate Counting List  BG Log Sheet  Patient will be seen for follow-up as needed.

## 2018-01-28 ENCOUNTER — Encounter: Payer: Self-pay | Admitting: Advanced Practice Midwife

## 2018-01-30 ENCOUNTER — Encounter: Payer: Self-pay | Admitting: Obstetrics and Gynecology

## 2018-02-07 DIAGNOSIS — Z029 Encounter for administrative examinations, unspecified: Secondary | ICD-10-CM

## 2018-02-08 ENCOUNTER — Inpatient Hospital Stay (HOSPITAL_COMMUNITY)
Admission: AD | Admit: 2018-02-08 | Discharge: 2018-02-08 | Disposition: A | Discharge status: Home / Self Care | Payer: 59 | Source: Ambulatory Visit | Attending: Obstetrics and Gynecology | Admitting: Obstetrics and Gynecology

## 2018-02-08 ENCOUNTER — Encounter (HOSPITAL_COMMUNITY): Payer: Self-pay | Admitting: *Deleted

## 2018-02-08 ENCOUNTER — Other Ambulatory Visit: Payer: Self-pay

## 2018-02-08 ENCOUNTER — Inpatient Hospital Stay (HOSPITAL_COMMUNITY): Admit: 2018-02-08 | Payer: Self-pay

## 2018-02-08 DIAGNOSIS — Z8249 Family history of ischemic heart disease and other diseases of the circulatory system: Secondary | ICD-10-CM | POA: Insufficient documentation

## 2018-02-08 DIAGNOSIS — Z79899 Other long term (current) drug therapy: Secondary | ICD-10-CM | POA: Diagnosis not present

## 2018-02-08 DIAGNOSIS — Z87891 Personal history of nicotine dependence: Secondary | ICD-10-CM | POA: Insufficient documentation

## 2018-02-08 DIAGNOSIS — Z8632 Personal history of gestational diabetes: Secondary | ICD-10-CM | POA: Diagnosis not present

## 2018-02-08 DIAGNOSIS — Z3689 Encounter for other specified antenatal screening: Secondary | ICD-10-CM

## 2018-02-08 DIAGNOSIS — O26893 Other specified pregnancy related conditions, third trimester: Secondary | ICD-10-CM | POA: Diagnosis not present

## 2018-02-08 DIAGNOSIS — Z833 Family history of diabetes mellitus: Secondary | ICD-10-CM | POA: Insufficient documentation

## 2018-02-08 DIAGNOSIS — Z3A31 31 weeks gestation of pregnancy: Secondary | ICD-10-CM | POA: Insufficient documentation

## 2018-02-08 DIAGNOSIS — D649 Anemia, unspecified: Secondary | ICD-10-CM | POA: Insufficient documentation

## 2018-02-08 DIAGNOSIS — R102 Pelvic and perineal pain: Secondary | ICD-10-CM | POA: Diagnosis not present

## 2018-02-08 DIAGNOSIS — O0990 Supervision of high risk pregnancy, unspecified, unspecified trimester: Secondary | ICD-10-CM

## 2018-02-08 DIAGNOSIS — O26899 Other specified pregnancy related conditions, unspecified trimester: Secondary | ICD-10-CM

## 2018-02-08 DIAGNOSIS — O99013 Anemia complicating pregnancy, third trimester: Secondary | ICD-10-CM | POA: Diagnosis not present

## 2018-02-08 LAB — URINALYSIS, ROUTINE W REFLEX MICROSCOPIC
Bilirubin Urine: NEGATIVE
Glucose, UA: NEGATIVE mg/dL
Hgb urine dipstick: NEGATIVE
Ketones, ur: NEGATIVE mg/dL
Nitrite: NEGATIVE
Protein, ur: NEGATIVE mg/dL
Specific Gravity, Urine: 1.004 — ABNORMAL LOW (ref 1.005–1.030)
pH: 7 (ref 5.0–8.0)

## 2018-02-08 LAB — WET PREP, GENITAL
Clue Cells Wet Prep HPF POC: NONE SEEN
Sperm: NONE SEEN
Trich, Wet Prep: NONE SEEN
Yeast Wet Prep HPF POC: NONE SEEN

## 2018-02-08 NOTE — Discharge Instructions (Signed)
Abdominal Pain During Pregnancy °Abdominal pain is common in pregnancy. Most of the time, it does not cause harm. There are many causes of abdominal pain. Some causes are more serious than others and sometimes the cause is not known. Abdominal pain can be a sign that something is very wrong with the pregnancy or the pain may have nothing to do with the pregnancy. Always tell your health care provider if you have any abdominal pain. °Follow these instructions at home: °· Do not have sex or put anything in your vagina until your symptoms go away completely. °· Watch your abdominal pain for any changes. °· Get plenty of rest until your pain improves. °· Drink enough fluid to keep your urine clear or pale yellow. °· Take over-the-counter or prescription medicines only as told by your health care provider. °· Keep all follow-up visits as told by your health care provider. This is important. °Contact a health care provider if: °· You have a fever. °· Your pain gets worse or you have cramping. °· Your pain continues after resting. °Get help right away if: °· You are bleeding, leaking fluid, or passing tissue from the vagina. °· You have vomiting or diarrhea that does not go away. °· You have painful or bloody urination. °· You notice a decrease in your baby's movements. °· You feel very weak or faint. °· You have shortness of breath. °· You develop a severe headache with abdominal pain. °· You have abnormal vaginal discharge with abdominal pain. °This information is not intended to replace advice given to you by your health care provider. Make sure you discuss any questions you have with your health care provider. °Document Released: 02/20/2005 Document Revised: 12/02/2015 Document Reviewed: 09/19/2012 °Elsevier Interactive Patient Education © 2018 Elsevier Inc. ° °Back Pain in Pregnancy °Back pain during pregnancy is common. Back pain may be caused by several factors that are related to changes during your  pregnancy. °Follow these instructions at home: °Managing pain, stiffness, and swelling °· If directed, apply ice for sudden (acute) back pain. °? Put ice in a plastic bag. °? Place a towel between your skin and the bag. °? Leave the ice on for 20 minutes, 2-3 times per day. °· If directed, apply heat to the affected area before you exercise: °? Place a towel between your skin and the heat pack or heating pad. °? Leave the heat on for 20-30 minutes. °? Remove the heat if your skin turns bright red. This is especially important if you are unable to feel pain, heat, or cold. You may have a greater risk of getting burned. °Activity °· Exercise as told by your health care provider. Exercising is the best way to prevent or manage back pain. °· Listen to your body when lifting. If lifting hurts, ask for help or bend your knees. This uses your leg muscles instead of your back muscles. °· Squat down when picking up something from the floor. Do not bend over. °· Only use bed rest as told by your health care provider. Bed rest should only be used for the most severe episodes of back pain. °Standing, Sitting, and Lying Down °· Do not stand in one place for long periods of time. °· Use good posture when sitting. Make sure your head rests over your shoulders and is not hanging forward. Use a pillow on your lower back if necessary. °· Try sleeping on your side, preferably the left side, with a pillow or two between your legs. If   If you are sore after a night's rest, your bed may be too soft. A firm mattress may provide more support for your back during pregnancy. General instructions  Do not wear high heels.  Eat a healthy diet. Try to gain weight within your health care provider's recommendations.  Use a maternity girdle, elastic sling, or back brace as told by your health care provider.  Take over-the-counter and prescription medicines only as told by your health care provider.  Keep all follow-up visits as told by your  health care provider. This is important. This includes any visits with any specialists, such as a physical therapist. Contact a health care provider if:  Your back pain interferes with your daily activities.  You have increasing pain in other parts of your body. Get help right away if:  You develop numbness, tingling, weakness, or problems with the use of your arms or legs.  You develop severe back pain that is not controlled with medicine.  You have a sudden change in bowel or bladder control.  You develop shortness of breath, dizziness, or you faint.  You develop nausea, vomiting, or sweating.  You have back pain that is a rhythmic, cramping pain similar to labor pains. Labor pain is usually 1-2 minutes apart, lasts for about 1 minute, and involves a bearing down feeling or pressure in your pelvis.  You have back pain and your water breaks or you have vaginal bleeding.  You have back pain or numbness that travels down your leg.  Your back pain developed after you fell.  You develop pain on one side of your back.  You see blood in your urine.  You develop skin blisters in the area of your back pain. This information is not intended to replace advice given to you by your health care provider. Make sure you discuss any questions you have with your health care provider. Document Released: 05/31/2005 Document Revised: 07/29/2015 Document Reviewed: 11/04/2014 Elsevier Interactive Patient Education  [employer]2018 [employer] Inc.

## 2018-02-08 NOTE — MAU Note (Signed)
Presents with c/o sharp shooting pain in vagina.  Denies VB or LOF.  Reports +FM.

## 2018-02-08 NOTE — MAU Provider Note (Signed)
History     CSN: [id number]  Arrival date and time: 02/08/18 1156   First Provider Initiated Contact with Patient 02/08/18 1245      Chief Complaint  Patient presents with  . Vaginal Pain   [id number] '@31'$ .6 wks here with pelvic pressure and pain. Sx started a few days ago. Describes as sharp and shooting and occurs with standing and walking. Pain is better when she lies down. Denies VB, ROM, and ctx. Reports good FM. No urinary sx. Having LBP. Was given Rx for maternity belt but hasn't picked up yet. Having some constipation, last BM yesterday.  OB History    Gravida  5   Para  2   Term  2   Preterm      AB  2   Living  2     SAB  1   TAB  1   Ectopic      Multiple      Live Births  2           Past Medical History:  Diagnosis Date  . Anemia   . Gestational diabetes     History reviewed. No pertinent surgical history.  Family History  Problem Relation Age of Onset  . Diabetes Mother   . Hypertension Mother   . Hypertension Maternal Grandmother     Social History   Tobacco Use  . Smoking status: Former Smoker    Last attempt to quit: 2019    Years since quitting: 0.9  . Smokeless tobacco: Never Used  Substance Use Topics  . Alcohol use: Not Currently  . Drug use: Never    Allergies: No Known Allergies  Medications Prior to Admission  Medication Sig Dispense Refill Last Dose  . Elastic Bandages & Supports (COMFORT FIT MATERNITY SUPP MED) MISC Use as directed to help with sciatic pain. 1 each 0   . escitalopram (LEXAPRO) 20 MG tablet Take 1 tablet (20 mg total) by mouth daily. 30 tablet 3   . ferrous sulfate 325 (65 FE) MG tablet Take 325 mg by mouth 2 (two) times daily.   Taking  . glucose blood test strip Use four times a day or as directed. ICD O24.419. 120 each 12   . glucose monitoring kit (FREESTYLE) monitoring kit Use as directed to monitor blood glucose. ICD O24.419 1 each 0   . LANCETS ULTRA THIN 30G MISC Use four times a day or as  directed. ICD O24.419 2 each 12   . Prenatal Vit-Fe Fumarate-FA (MULTIVITAMIN-PRENATAL) 27-0.8 MG TABS tablet Take 1 tablet by mouth daily at 12 noon.   Taking  . terconazole (TERAZOL 7) 0.4 % vaginal cream Place 1 applicator vaginally at bedtime. 45 g 0 Taking    Review of Systems  Gastrointestinal: Positive for constipation. Negative for abdominal pain.  Genitourinary: Positive for pelvic pain. Negative for dysuria, frequency, urgency, vaginal bleeding and vaginal discharge.  Musculoskeletal: Positive for back pain.   Physical Exam   Blood pressure 128/72, pulse 74, temperature 98 F (36.7 C), temperature source Oral, resp. rate 16, height '5\' 9"'$  (1.753 m), weight 110.9 kg, last menstrual period 06/30/2017, SpO2 98 %, unknown if currently breastfeeding.  Physical Exam  Constitutional: She is oriented to person, place, and time. She appears well-developed and well-nourished. No distress.  HENT:  Head: Normocephalic and atraumatic.  Neck: Normal range of motion.  Cardiovascular: Normal rate.  Respiratory: Effort normal. No respiratory distress.  GI: Soft. She exhibits no distension. There is no tenderness.  gravid  Genitourinary:  Genitourinary Comments: SVE closed/thick  Musculoskeletal: Normal range of motion.  Neurological: She is alert and oriented to person, place, and time.  Skin: Skin is warm and dry.  Psychiatric: She has a normal mood and affect.   Results for orders placed or performed during the hospital encounter of 02/08/18 (from the past 24 hour(s))  Urinalysis, Routine w reflex microscopic     Status: Abnormal   Collection Time: 02/08/18 12:24 PM  Result Value Ref Range   Color, Urine YELLOW YELLOW   APPearance CLOUDY (A) CLEAR   Specific Gravity, Urine 1.004 (L) 1.005 - 1.030   pH 7.0 5.0 - 8.0   Glucose, UA NEGATIVE NEGATIVE mg/dL   Hgb urine dipstick NEGATIVE NEGATIVE   Bilirubin Urine NEGATIVE NEGATIVE   Ketones, ur NEGATIVE NEGATIVE mg/dL   Protein, ur  NEGATIVE NEGATIVE mg/dL   Nitrite NEGATIVE NEGATIVE   Leukocytes, UA LARGE (A) NEGATIVE   RBC / HPF 0-5 0 - 5 RBC/hpf   WBC, UA 11-20 0 - 5 WBC/hpf   Bacteria, UA MANY (A) NONE SEEN   Squamous Epithelial / LPF 21-50 0 - 5  Wet prep, genital     Status: Abnormal   Collection Time: 02/08/18 12:53 PM  Result Value Ref Range   Yeast Wet Prep HPF POC NONE SEEN NONE SEEN   Trich, Wet Prep NONE SEEN NONE SEEN   Clue Cells Wet Prep HPF POC NONE SEEN NONE SEEN   WBC, Wet Prep HPF POC MANY (A) NONE SEEN   Sperm NONE SEEN    MAU Course  Procedures  MDM Labs ordered and reviewed. UA with large leuks and bacteria, pt asymptomatic, will send UC. No evidence of PTL. Pain consistent with advancing pregnancy. Recommend maternity belt, tub soaks, and Tylenol. Stable for discharge home.  Assessment and Plan   1. [redacted] weeks gestation of pregnancy   2. Supervision of high risk pregnancy, antepartum   3. NST (non-stress test) reactive   4. Pelvic pressure in pregnancy    Discharge home Follow up in OB office as scheduled PTL precautions Miralax prn Increase water, and dietary fiber  Allergies as of 02/08/2018   No Known Allergies     Medication List    TAKE these medications   COMFORT FIT MATERNITY SUPP MED Misc Use as directed to help with sciatic pain.   escitalopram 20 MG tablet Commonly known as:  LEXAPRO Take 1 tablet (20 mg total) by mouth daily.   ferrous sulfate 325 (65 FE) MG tablet Take 325 mg by mouth 2 (two) times daily.   glucose blood test strip Use four times a day or as directed. ICD O24.419.   glucose monitoring kit monitoring kit Use as directed to monitor blood glucose. ICD O24.419   LANCETS ULTRA THIN 30G Misc Use four times a day or as directed. ICD O24.419   multivitamin-prenatal 27-0.8 MG Tabs tablet Take 1 tablet by mouth daily at 12 noon.   terconazole 0.4 % vaginal cream Commonly known as:  TERAZOL 7 Place 1 applicator vaginally at bedtime.       Julianne Handler, CNM 02/08/2018, 1:34 PM

## 2018-02-09 LAB — CULTURE, OB URINE: Culture: 50000 — AB

## 2018-02-11 ENCOUNTER — Encounter: Payer: Self-pay | Admitting: Advanced Practice Midwife

## 2018-02-11 ENCOUNTER — Ambulatory Visit (INDEPENDENT_AMBULATORY_CARE_PROVIDER_SITE_OTHER): Payer: 59 | Admitting: Advanced Practice Midwife

## 2018-02-11 VITALS — BP 120/62 | HR 69 | Wt 245.7 lb

## 2018-02-11 DIAGNOSIS — O0990 Supervision of high risk pregnancy, unspecified, unspecified trimester: Secondary | ICD-10-CM

## 2018-02-11 DIAGNOSIS — O0993 Supervision of high risk pregnancy, unspecified, third trimester: Secondary | ICD-10-CM

## 2018-02-11 DIAGNOSIS — O24410 Gestational diabetes mellitus in pregnancy, diet controlled: Secondary | ICD-10-CM

## 2018-02-11 LAB — GC/CHLAMYDIA PROBE AMP (~~LOC~~) NOT AT ARMC
Chlamydia: NEGATIVE
Neisseria Gonorrhea: NEGATIVE

## 2018-02-11 NOTE — Progress Notes (Signed)
Pt states has been having a lot of pain, Friday went to MAU 02/08/18 was having a lot of Vaginal Pain, sciatic pain also.

## 2018-02-11 NOTE — Progress Notes (Signed)
   PRENATAL VISIT NOTE  Subjective:  Kelly Morales is a 27 y.o. [id number] at 7057w2d being seen today for ongoing prenatal care.  She is currently monitored for the following issues for this high-risk pregnancy and has Supervision of high risk pregnancy, antepartum; Poor dentition; BMI 33.0-33.9,adult; Iron deficiency anemia; Mixed anxiety and depressive disorder; Gestational diabetes; and GBS bacteriuria on their problem list.  Patient reports no complaints.  Contractions: Irritability. Vag. Bleeding: None.  Movement: Present. Denies leaking of fluid.   The following portions of the patient's history were reviewed and updated as appropriate: allergies, current medications, past family history, past medical history, past social history, past surgical history and problem list. Problem list updated.  Objective:   Vitals:   02/11/18 1546  BP: 120/62  Pulse: 69  Weight: 245 lb 11.2 oz (111.4 kg)    Fetal Status: Fetal Heart Rate (bpm): 151 Fundal Height: 32 cm Movement: Present     General:  Alert, oriented and cooperative. Patient is in no acute distress.  Skin: Skin is warm and dry. No rash noted.   Cardiovascular: Normal heart rate noted  Respiratory: Normal respiratory effort, no problems with respiration noted  Abdomen: Soft, gravid, appropriate for gestational age.  Pain/Pressure: Present     Pelvic: Cervical exam deferred        Extremities: Normal range of motion.  Edema: Trace  Mental Status: Normal mood and affect. Normal behavior. Normal judgment and thought content.   Assessment and Plan:  Pregnancy: [id number] at 6557w2d  1. Supervision of high risk pregnancy, antepartum - Routine care  2. Diet controlled gestational diabetes mellitus (GDM) in third trimester - Patient forgot her log today. She states that her fasting levels have mostly been fine, but post-prandial are "all over the place". Sometimes fine, sometimes 140. Having a hard time with the diet. Discussed some  tips for diet. Will bring log next week to review.  - Has FU US on Friday 02/15/18   Preterm labor symptoms and general obstetric precautions including but not limited to vaginal bleeding, contractions, leaking of fluid and fetal movement were reviewed in detail with the patient. Please refer to After Visit Summary for other counseling recommendations.  Return in about 1 week (around 02/18/2018) for high risk .  Future Appointments  Date Time Provider Department Center  02/15/2018  4:00 PM [facility] US 3 [facility]  02/25/2018  3:55 PM Armando ReichertHogan, Jamonta Goerner D, CNM [facility]    Kelly Morales, CNM

## 2018-02-15 ENCOUNTER — Encounter (HOSPITAL_COMMUNITY): Payer: Self-pay

## 2018-02-15 ENCOUNTER — Ambulatory Visit (HOSPITAL_COMMUNITY)
Admission: RE | Admit: 2018-02-15 | Discharge: 2018-02-15 | Disposition: A | Discharge status: Home / Self Care | Payer: 59 | Source: Ambulatory Visit | Attending: Advanced Practice Midwife | Admitting: Advanced Practice Midwife

## 2018-02-15 DIAGNOSIS — Z3A32 32 weeks gestation of pregnancy: Secondary | ICD-10-CM | POA: Diagnosis not present

## 2018-02-15 DIAGNOSIS — O0990 Supervision of high risk pregnancy, unspecified, unspecified trimester: Secondary | ICD-10-CM

## 2018-02-15 DIAGNOSIS — Z362 Encounter for other antenatal screening follow-up: Secondary | ICD-10-CM | POA: Diagnosis not present

## 2018-02-15 DIAGNOSIS — O24410 Gestational diabetes mellitus in pregnancy, diet controlled: Secondary | ICD-10-CM | POA: Insufficient documentation

## 2018-02-18 ENCOUNTER — Other Ambulatory Visit (HOSPITAL_COMMUNITY): Payer: Self-pay | Admitting: *Deleted

## 2018-02-18 DIAGNOSIS — O24410 Gestational diabetes mellitus in pregnancy, diet controlled: Secondary | ICD-10-CM

## 2018-02-19 DIAGNOSIS — Z3482 Encounter for supervision of other normal pregnancy, second trimester: Secondary | ICD-10-CM | POA: Diagnosis not present

## 2018-02-19 DIAGNOSIS — Z3483 Encounter for supervision of other normal pregnancy, third trimester: Secondary | ICD-10-CM | POA: Diagnosis not present

## 2018-02-25 ENCOUNTER — Ambulatory Visit (INDEPENDENT_AMBULATORY_CARE_PROVIDER_SITE_OTHER): Payer: 59 | Admitting: Advanced Practice Midwife

## 2018-02-25 ENCOUNTER — Encounter: Payer: Self-pay | Admitting: Advanced Practice Midwife

## 2018-02-25 VITALS — BP 113/58 | HR 75 | Wt 246.0 lb

## 2018-02-25 DIAGNOSIS — O0990 Supervision of high risk pregnancy, unspecified, unspecified trimester: Secondary | ICD-10-CM

## 2018-02-25 DIAGNOSIS — O0993 Supervision of high risk pregnancy, unspecified, third trimester: Secondary | ICD-10-CM

## 2018-02-25 MED ORDER — METFORMIN HCL 500 MG PO TABS: 500.0000 mg | ORAL_TABLET | Freq: Two times a day (BID) | ORAL | 0 refills | Status: DC

## 2018-02-25 NOTE — Progress Notes (Signed)
   PRENATAL VISIT NOTE  Subjective:  Kelly Morales is a 27 y.o. [id number] at 3291w2d being seen today for ongoing prenatal care.  She is currently monitored for the following issues for this high-risk pregnancy and has Supervision of high risk pregnancy, antepartum; Poor dentition; BMI 33.0-33.9,adult; Iron deficiency anemia; Mixed anxiety and depressive disorder; Gestational diabetes; and GBS bacteriuria on their problem list.  Patient reports no complaints.  Contractions: Irritability. Vag. Bleeding: None.  Movement: Present. Denies leaking of fluid.   The following portions of the patient's history were reviewed and updated as appropriate: allergies, current medications, past family history, past medical history, past social history, past surgical history and problem list. Problem list updated.  Objective:   Vitals:   02/25/18 1528  BP: (!) 113/58  Pulse: 75  Weight: 246 lb (111.6 kg)    Fetal Status: Fetal Heart Rate (bpm): 165 Fundal Height: 36 cm Movement: Present     General:  Alert, oriented and cooperative. Patient is in no acute distress.  Skin: Skin is warm and dry. No rash noted.   Cardiovascular: Normal heart rate noted  Respiratory: Normal respiratory effort, no problems with respiration noted  Abdomen: Soft, gravid, appropriate for gestational age.  Pain/Pressure: Present     Pelvic: Cervical exam deferred        Extremities: Normal range of motion.  Edema: Trace  Mental Status: Normal mood and affect. Normal behavior. Normal judgment and thought content.   Assessment and Plan:  Pregnancy: [id number] at 3191w2d  1. Supervision of high risk pregnancy, antepartum    2. Gestational diabetes- diet controlled  - 88%til on 02/15/18  - Glucose log reviewed:  Fasting: WNL  PP: approx 9 out of range.   Consult with Dr. Adrian BlackwaterStinson. Will start on Metformin 500mg  q am, and return in one week. May need to increase to BID  Preterm labor symptoms and general obstetric  precautions including but not limited to vaginal bleeding, contractions, leaking of fluid and fetal movement were reviewed in detail with the patient. Please refer to After Visit Summary for other counseling recommendations.  Return in about 1 week (around 03/04/2018) for high risk needs BPP/NST .  Future Appointments  Date Time Provider Department Center  02/25/2018  3:55 PM Eugenio HoesHogan, Heather D, [facility]M [facility]  03/15/2018  3:30 PM [facility] US 1 [facility]    Kelly Morales, CNM

## 2018-02-25 NOTE — Progress Notes (Signed)
Gluco logs in Phone.

## 2018-03-04 ENCOUNTER — Other Ambulatory Visit: Payer: Self-pay

## 2018-03-04 ENCOUNTER — Encounter: Payer: Self-pay | Admitting: Family Medicine

## 2018-03-04 ENCOUNTER — Encounter: Payer: Self-pay | Admitting: Obstetrics & Gynecology

## 2018-03-06 NOTE — L&D Delivery Note (Addendum)
Delivery Note At 12:43 PM a viable female was delivered via  (Presentation: vertex, OA). APGAR: 3, 6; weight: 4390g.   Placenta status: vertex, OA. Cord: 3-vessel with the following complications: none.  Cord arterial pH: 7.13; Cord venous pH was 7.3.  Anesthesia:  epidural Episiotomy:  none Lacerations:  2nd degree, perineal Suture Repair: 3.0 vicryl Est. Blood Loss (mL): 75cc  Mom to postpartum.  Baby to Couplet care / Skin to Skin.  Dollene Cleveland 03/31/2018, 1:17 PM   The patient was noted to be complete and pushing. Patient noted to have an epidural anesthesia.   The patient was asked to push and the head delivered spontaneously in the OA position, over an intact perineum. A nuchal cord was checked and easily relieved.   The anterior shoulder delivered easily and the posterior shoulder followed. The remainder of the infant was easily delivered and placed on mom's chest skin-to-skin where nursing and NICU personnel were in attendance.  The oropharynx and nasopharynx were bulb suctioned. After about 45-second delay the cord was clamped x 2 and cut by father of the baby. A CODE APGAR was called and the baby was then passed to the warmer where he was checked and stimulated by nursing and NICU staff. The infant was then noted to have spontaneous cry and movement of all 4 extremities.  Cord blood and cord pH were then obtained, arterial pH was 7.13, venous pH was 7.3.   The placenta delivered intact spontaneously. Pitocin was started IV to firm the uterus.   Examination of the cervix and vaginal vault revealed no laceration, but examination of the perineum showed a 1.5cm 2nd-degree laceration. The laceration was repaired with 3.0 vicryl after being injected with 14cc 1% Lidocaine. The patient tolerated the procedure well. A vaginal pack was then placed.   All sponge and needle counts were correct. Luna Kitchens was present for the entire procedure.    Please schedule this patient for  Postpartum visit in: 4 weeks with the following provider: Any provider For C/S patients schedule nurse incision check in weeks 2 weeks: no High risk pregnancy complicated by: GDM Delivery mode:  SVD Anticipated Birth Control:  IUD PP Procedures needed: 2 hour GTT  Schedule Integrated BH visit: no

## 2018-03-11 ENCOUNTER — Ambulatory Visit (INDEPENDENT_AMBULATORY_CARE_PROVIDER_SITE_OTHER): Payer: 59 | Admitting: *Deleted

## 2018-03-11 ENCOUNTER — Ambulatory Visit: Payer: Self-pay

## 2018-03-11 ENCOUNTER — Ambulatory Visit (INDEPENDENT_AMBULATORY_CARE_PROVIDER_SITE_OTHER): Payer: 59 | Admitting: Obstetrics & Gynecology

## 2018-03-11 VITALS — BP 112/67 | HR 85 | Wt 249.1 lb

## 2018-03-11 DIAGNOSIS — Z113 Encounter for screening for infections with a predominantly sexual mode of transmission: Secondary | ICD-10-CM

## 2018-03-11 DIAGNOSIS — N898 Other specified noninflammatory disorders of vagina: Secondary | ICD-10-CM

## 2018-03-11 DIAGNOSIS — O0990 Supervision of high risk pregnancy, unspecified, unspecified trimester: Secondary | ICD-10-CM

## 2018-03-11 DIAGNOSIS — O24415 Gestational diabetes mellitus in pregnancy, controlled by oral hypoglycemic drugs: Secondary | ICD-10-CM | POA: Diagnosis not present

## 2018-03-11 DIAGNOSIS — O0993 Supervision of high risk pregnancy, unspecified, third trimester: Secondary | ICD-10-CM

## 2018-03-11 NOTE — Progress Notes (Signed)
Pt reports decreased fetal movement today. She has also noticed increased vaginal d/c vs clear fluid leaking - she is not sure. Pt has only been taking Metformin once daily in the morning - did not know it should be twice daily. She has been having diarrhea, nausea and stomach cramps since starting the Metformin. US for growth scheduled on 1/10.

## 2018-03-11 NOTE — Progress Notes (Signed)
   PRENATAL VISIT NOTE  Subjective:  Kelly Morales is a 28 y.o. [id number] at [redacted]w[redacted]d being seen today for ongoing prenatal care.  She is currently monitored for the following issues for this high-risk pregnancy and has Supervision of high risk pregnancy, antepartum; Poor dentition; BMI 33.0-33.9,adult; Iron deficiency anemia; Mixed anxiety and depressive disorder; Gestational diabetes; and GBS bacteriuria on their problem list.  Patient reports vaginal irritation.  Contractions: Irregular. Vag. Bleeding: None.  Movement: (!) Decreased. Denies leaking of fluid.   The following portions of the patient's history were reviewed and updated as appropriate: allergies, current medications, past family history, past medical history, past social history, past surgical history and problem list. Problem list updated.  Objective:   Vitals:   03/11/18 1031  BP: 112/67  Pulse: 85  Weight: 249 lb 1.6 oz (113 kg)    Fetal Status: Fetal Heart Rate (bpm): NST   Movement: (!) Decreased     General:  Alert, oriented and cooperative. Patient is in no acute distress.  Skin: Skin is warm and dry. No rash noted.   Cardiovascular: Normal heart rate noted  Respiratory: Normal respiratory effort, no problems with respiration noted  Abdomen: Soft, gravid, appropriate for gestational age.  Pain/Pressure: Present     Pelvic: Cervical exam performed        Extremities: Normal range of motion.  Edema: Trace  Mental Status: Normal mood and affect. Normal behavior. Normal judgment and thought content.   Assessment and Plan:  Pregnancy: [id number] at [redacted]w[redacted]d  1. Supervision of high risk pregnancy, antepartum GC and CT - Cervicovaginal ancillary only( [place])  2. Gestational diabetes mellitus (GDM) in third trimester controlled on oral hypoglycemic drug States BG values are nl, stable  3. Vaginal discharge  - Cervicovaginal ancillary only( [place]) NST reviewed, reactive Preterm labor symptoms and  general obstetric precautions including but not limited to vaginal bleeding, contractions, leaking of fluid and fetal movement were reviewed in detail with the patient. Please refer to After Visit Summary for other counseling recommendations.  Return in about 1 week (around 03/18/2018) for NST/BPP and HOB; 1/20  NST/BPP and HOB.  Future Appointments  Date Time Provider Department Center  03/15/2018  3:30 PM [facility] [place] 1 [facility]    Scheryl Darter, MD

## 2018-03-11 NOTE — Progress Notes (Signed)

## 2018-03-12 LAB — CERVICOVAGINAL ANCILLARY ONLY
Bacterial vaginitis: NEGATIVE
CHLAMYDIA, DNA PROBE: NEGATIVE
Candida vaginitis: NEGATIVE
NEISSERIA GONORRHEA: NEGATIVE
Trichomonas: NEGATIVE

## 2018-03-15 ENCOUNTER — Encounter (HOSPITAL_COMMUNITY): Payer: Self-pay

## 2018-03-15 ENCOUNTER — Ambulatory Visit (HOSPITAL_COMMUNITY): Admission: RE | Admit: 2018-03-15 | Payer: 59 | Source: Ambulatory Visit

## 2018-03-20 ENCOUNTER — Ambulatory Visit (INDEPENDENT_AMBULATORY_CARE_PROVIDER_SITE_OTHER): Payer: 59 | Admitting: *Deleted

## 2018-03-20 ENCOUNTER — Encounter: Payer: Self-pay | Admitting: *Deleted

## 2018-03-20 ENCOUNTER — Ambulatory Visit (INDEPENDENT_AMBULATORY_CARE_PROVIDER_SITE_OTHER): Payer: 59 | Admitting: Obstetrics and Gynecology

## 2018-03-20 ENCOUNTER — Ambulatory Visit: Payer: Self-pay

## 2018-03-20 VITALS — BP 115/67 | HR 83 | Wt 248.9 lb

## 2018-03-20 DIAGNOSIS — Z3A37 37 weeks gestation of pregnancy: Secondary | ICD-10-CM

## 2018-03-20 DIAGNOSIS — O24415 Gestational diabetes mellitus in pregnancy, controlled by oral hypoglycemic drugs: Secondary | ICD-10-CM | POA: Diagnosis not present

## 2018-03-20 DIAGNOSIS — O0990 Supervision of high risk pregnancy, unspecified, unspecified trimester: Secondary | ICD-10-CM

## 2018-03-20 DIAGNOSIS — O0993 Supervision of high risk pregnancy, unspecified, third trimester: Secondary | ICD-10-CM

## 2018-03-20 LAB — POCT URINALYSIS DIP (DEVICE)
Bilirubin Urine: NEGATIVE
Glucose, UA: NEGATIVE mg/dL
Hgb urine dipstick: NEGATIVE
Ketones, ur: NEGATIVE mg/dL
Nitrite: NEGATIVE
Protein, ur: NEGATIVE mg/dL
SPECIFIC GRAVITY, URINE: 1.01 (ref 1.005–1.030)
Urobilinogen, UA: 0.2 mg/dL (ref 0.0–1.0)
pH: 6.5 (ref 5.0–8.0)

## 2018-03-20 NOTE — Progress Notes (Signed)
Prenatal Visit Note Date: 03/20/2018 Clinic: [facility]  Subjective:  Kelly Morales is a 28 y.o. [id number] at 3161w4d being seen today for ongoing prenatal care.  She is currently monitored for the following issues for this high-risk pregnancy and has Supervision of high risk pregnancy, antepartum; Poor dentition; BMI 33.0-33.9,adult; Iron deficiency anemia; Mixed anxiety and depressive disorder; Gestational diabetes mellitus (GDM) controlled on oral hypoglycemic drug, antepartum; and GBS bacteriuria on their problem list.  Patient reports no complaints.   Contractions: Irregular. Vag. Bleeding: None.  Movement: Present. Denies leaking of fluid.   The following portions of the patient's history were reviewed and updated as appropriate: allergies, current medications, past family history, past medical history, past social history, past surgical history and problem list. Problem list updated.  Objective:   Vitals:   03/20/18 1401  BP: 115/67  Pulse: 83  Weight: 248 lb 14.4 oz (112.9 kg)    Fetal Status: Fetal Heart Rate (bpm): NST   Movement: Present     General:  Alert, oriented and cooperative. Patient is in no acute distress.  Skin: Skin is warm and dry. No rash noted.   Cardiovascular: Normal heart rate noted  Respiratory: Normal respiratory effort, no problems with respiration noted  Abdomen: Soft, gravid, appropriate for gestational age. Pain/Pressure: Present     Pelvic:  Cervical exam deferred        Extremities: Normal range of motion.     Mental Status: Normal mood and affect. Normal behavior. Normal judgment and thought content.   Urinalysis:      Assessment and Plan:  Pregnancy: [id number] at 5961w4d  1. Supervision of high risk pregnancy, antepartum Routine care.   2. Gestational diabetes mellitus (GDM) controlled on oral hypoglycemic drug, antepartum See RN note. Recommend 500 with breakfast (like what she's doing and doing 500 with dinner).  Forgot logbook but gives normal AM fasting (a few in the high 90s) and normal 2hr PP #s. bpp 10/10, cephalic. Has rpt growth later this week. 39wk iol set up  Term labor symptoms and general obstetric precautions including but not limited to vaginal bleeding, contractions, leaking of fluid and fetal movement were reviewed in detail with the patient. Please refer to After Visit Summary for other counseling recommendations.  Return in about 1 week (around 03/27/2018) for as scheduled.   Ewing BingPickens, Susanna Benge, MD

## 2018-03-20 NOTE — Progress Notes (Signed)

## 2018-03-20 NOTE — Progress Notes (Signed)
Pt DNKA @ MFM on 1/10 for [place] growth - rescheduled for 1/17 @ 1330.  Pt states she has been taking Metformin @ 10 am and 2pm. Pt was advised this is too close together and to take 2nd dose with evening meal.  IOL scheduled 1/25 @ 0700.

## 2018-03-21 ENCOUNTER — Telehealth (HOSPITAL_COMMUNITY): Payer: Self-pay | Admitting: *Deleted

## 2018-03-21 NOTE — Telephone Encounter (Signed)
Preadmission screen  

## 2018-03-22 ENCOUNTER — Encounter (HOSPITAL_COMMUNITY): Payer: Self-pay

## 2018-03-22 ENCOUNTER — Ambulatory Visit (HOSPITAL_COMMUNITY)
Admission: RE | Admit: 2018-03-22 | Discharge: 2018-03-22 | Disposition: A | Discharge status: Home / Self Care | Payer: 59 | Source: Ambulatory Visit | Attending: Advanced Practice Midwife | Admitting: Advanced Practice Midwife

## 2018-03-22 DIAGNOSIS — O24415 Gestational diabetes mellitus in pregnancy, controlled by oral hypoglycemic drugs: Secondary | ICD-10-CM

## 2018-03-22 DIAGNOSIS — O0990 Supervision of high risk pregnancy, unspecified, unspecified trimester: Secondary | ICD-10-CM | POA: Diagnosis not present

## 2018-03-22 DIAGNOSIS — O24410 Gestational diabetes mellitus in pregnancy, diet controlled: Secondary | ICD-10-CM | POA: Insufficient documentation

## 2018-03-22 DIAGNOSIS — Z362 Encounter for other antenatal screening follow-up: Secondary | ICD-10-CM | POA: Diagnosis not present

## 2018-03-22 DIAGNOSIS — Z3A37 37 weeks gestation of pregnancy: Secondary | ICD-10-CM

## 2018-03-25 ENCOUNTER — Other Ambulatory Visit: Payer: Self-pay | Admitting: Family Medicine

## 2018-03-27 ENCOUNTER — Other Ambulatory Visit: Payer: Self-pay

## 2018-03-27 ENCOUNTER — Encounter: Payer: Self-pay | Admitting: Obstetrics & Gynecology

## 2018-03-27 NOTE — Telephone Encounter (Signed)
The patient called in to cancel the appointment due to transportation. Stated she does not have a ride to the appointment and she did not see a point in rescheduling because she is being induced on Saturday. Talked with an nurse regarding the patient's decision and advised the patient we need to schedule an appointment as soon as possible. Stated she will not have be able to come until Friday and she may not have a ride. Stated she already had a doctor check on the baby and her. She more than likely wont make the appointment Friday. Cancelled the visit for today and made no further appointments.

## 2018-03-30 ENCOUNTER — Inpatient Hospital Stay (HOSPITAL_COMMUNITY)
Admission: RE | Admit: 2018-03-30 | Discharge: 2018-04-01 | DRG: 807 | Disposition: A | Discharge status: Home / Self Care | Payer: 59 | Attending: Obstetrics and Gynecology | Admitting: Obstetrics and Gynecology

## 2018-03-30 ENCOUNTER — Inpatient Hospital Stay (HOSPITAL_COMMUNITY): Payer: 59 | Admitting: Anesthesiology

## 2018-03-30 ENCOUNTER — Encounter (HOSPITAL_COMMUNITY): Payer: Self-pay

## 2018-03-30 ENCOUNTER — Other Ambulatory Visit: Payer: Self-pay

## 2018-03-30 DIAGNOSIS — O24425 Gestational diabetes mellitus in childbirth, controlled by oral hypoglycemic drugs: Principal | ICD-10-CM | POA: Diagnosis present

## 2018-03-30 DIAGNOSIS — Z8632 Personal history of gestational diabetes: Secondary | ICD-10-CM | POA: Diagnosis present

## 2018-03-30 DIAGNOSIS — O99824 Streptococcus B carrier state complicating childbirth: Secondary | ICD-10-CM | POA: Diagnosis not present

## 2018-03-30 DIAGNOSIS — Z87891 Personal history of nicotine dependence: Secondary | ICD-10-CM | POA: Diagnosis not present

## 2018-03-30 DIAGNOSIS — O24419 Gestational diabetes mellitus in pregnancy, unspecified control: Secondary | ICD-10-CM | POA: Diagnosis present

## 2018-03-30 DIAGNOSIS — Z3A39 39 weeks gestation of pregnancy: Secondary | ICD-10-CM

## 2018-03-30 DIAGNOSIS — O0990 Supervision of high risk pregnancy, unspecified, unspecified trimester: Secondary | ICD-10-CM

## 2018-03-30 DIAGNOSIS — D509 Iron deficiency anemia, unspecified: Secondary | ICD-10-CM | POA: Diagnosis present

## 2018-03-30 DIAGNOSIS — O9902 Anemia complicating childbirth: Secondary | ICD-10-CM | POA: Diagnosis present

## 2018-03-30 LAB — GLUCOSE, CAPILLARY
GLUCOSE-CAPILLARY: 114 mg/dL — AB (ref 70–99)
Glucose-Capillary: 83 mg/dL (ref 70–99)
Glucose-Capillary: 85 mg/dL (ref 70–99)
Glucose-Capillary: 86 mg/dL (ref 70–99)
Glucose-Capillary: 89 mg/dL (ref 70–99)

## 2018-03-30 LAB — CBC
HCT: 30.8 % — ABNORMAL LOW (ref 36.0–46.0)
Hemoglobin: 9.2 g/dL — ABNORMAL LOW (ref 12.0–15.0)
MCH: 23.8 pg — AB (ref 26.0–34.0)
MCHC: 29.9 g/dL — ABNORMAL LOW (ref 30.0–36.0)
MCV: 79.6 fL — ABNORMAL LOW (ref 80.0–100.0)
Platelets: 324 10*3/uL (ref 150–400)
RBC: 3.87 MIL/uL (ref 3.87–5.11)
RDW: 18.3 % — ABNORMAL HIGH (ref 11.5–15.5)
WBC: 8.3 10*3/uL (ref 4.0–10.5)
nRBC: 0 % (ref 0.0–0.2)

## 2018-03-30 LAB — TYPE AND SCREEN
ABO/RH(D): O POS
Antibody Screen: NEGATIVE

## 2018-03-30 LAB — ABO/RH: ABO/RH(D): O POS

## 2018-03-30 MED ORDER — FLEET ENEMA 7-19 GM/118ML RE ENEM: 1.0000 | ENEMA | RECTAL | Status: DC | PRN

## 2018-03-30 MED ORDER — LIDOCAINE HCL (PF) 1 % IJ SOLN
30.0000 mL | INTRAMUSCULAR | Status: DC | PRN
  Administered 2018-03-31: 30 mL via SUBCUTANEOUS
  Filled 2018-03-30: qty 30

## 2018-03-30 MED ORDER — LIDOCAINE HCL (PF) 1 % IJ SOLN
INTRAMUSCULAR | Status: DC | PRN
  Administered 2018-03-30 (×2): 4 mL via EPIDURAL

## 2018-03-30 MED ORDER — LACTATED RINGERS IV SOLN: 500.0000 mL | Freq: Once | INTRAVENOUS | Status: DC

## 2018-03-30 MED ORDER — SODIUM CHLORIDE 0.9 % IV SOLN
5.0000 10*6.[IU] | Freq: Once | INTRAVENOUS | Status: AC
  Administered 2018-03-30: 5 10*6.[IU] via INTRAVENOUS
  Filled 2018-03-30: qty 5

## 2018-03-30 MED ORDER — MISOPROSTOL 25 MCG QUARTER TABLET
25.0000 ug | ORAL_TABLET | ORAL | Status: DC | PRN
  Administered 2018-03-30: 25 ug via VAGINAL
  Filled 2018-03-30: qty 1

## 2018-03-30 MED ORDER — LACTATED RINGERS IV SOLN
500.0000 mL | Freq: Once | INTRAVENOUS | Status: AC
  Administered 2018-03-30: 500 mL via INTRAVENOUS

## 2018-03-30 MED ORDER — TERBUTALINE SULFATE 1 MG/ML IJ SOLN: 0.2500 mg | Freq: Once | INTRAMUSCULAR | Status: DC | PRN

## 2018-03-30 MED ORDER — MISOPROSTOL 50MCG HALF TABLET
50.0000 ug | ORAL_TABLET | Freq: Once | ORAL | Status: AC
  Administered 2018-03-30: 50 ug via BUCCAL
  Filled 2018-03-30: qty 1

## 2018-03-30 MED ORDER — PHENYLEPHRINE 40 MCG/ML (10ML) SYRINGE FOR IV PUSH (FOR BLOOD PRESSURE SUPPORT)
80.0000 ug | PREFILLED_SYRINGE | INTRAVENOUS | Status: DC | PRN
  Filled 2018-03-30 (×2): qty 10

## 2018-03-30 MED ORDER — FENTANYL CITRATE (PF) 100 MCG/2ML IJ SOLN: 100.0000 ug | INTRAMUSCULAR | Status: DC | PRN

## 2018-03-30 MED ORDER — SOD CITRATE-CITRIC ACID 500-334 MG/5ML PO SOLN: 30.0000 mL | ORAL | Status: DC | PRN

## 2018-03-30 MED ORDER — PHENYLEPHRINE 40 MCG/ML (10ML) SYRINGE FOR IV PUSH (FOR BLOOD PRESSURE SUPPORT)
80.0000 ug | PREFILLED_SYRINGE | INTRAVENOUS | Status: DC | PRN
  Filled 2018-03-30: qty 10

## 2018-03-30 MED ORDER — OXYTOCIN BOLUS FROM INFUSION
500.0000 mL | Freq: Once | INTRAVENOUS | Status: AC
  Administered 2018-03-31: 500 mL via INTRAVENOUS

## 2018-03-30 MED ORDER — EPHEDRINE 5 MG/ML INJ
10.0000 mg | INTRAVENOUS | Status: DC | PRN
  Filled 2018-03-30: qty 2

## 2018-03-30 MED ORDER — FENTANYL 2.5 MCG/ML BUPIVACAINE 1/10 % EPIDURAL INFUSION (WH - ANES)
14.0000 mL/h | INTRAMUSCULAR | Status: DC | PRN
  Administered 2018-03-30 – 2018-03-31 (×3): 14 mL/h via EPIDURAL
  Filled 2018-03-30 (×3): qty 100

## 2018-03-30 MED ORDER — PENICILLIN G 3 MILLION UNITS IVPB - SIMPLE MED
3.0000 10*6.[IU] | INTRAVENOUS | Status: DC
  Administered 2018-03-30 – 2018-03-31 (×6): 3 10*6.[IU] via INTRAVENOUS
  Filled 2018-03-30 (×5): qty 100

## 2018-03-30 MED ORDER — LACTATED RINGERS IV SOLN
500.0000 mL | INTRAVENOUS | Status: DC | PRN
  Administered 2018-03-30: 500 mL via INTRAVENOUS

## 2018-03-30 MED ORDER — ONDANSETRON HCL 4 MG/2ML IJ SOLN
4.0000 mg | Freq: Four times a day (QID) | INTRAMUSCULAR | Status: DC | PRN
  Administered 2018-03-31: 4 mg via INTRAVENOUS
  Filled 2018-03-30: qty 2

## 2018-03-30 MED ORDER — DIPHENHYDRAMINE HCL 50 MG/ML IJ SOLN
12.5000 mg | INTRAMUSCULAR | Status: DC | PRN
  Administered 2018-03-30: 12.5 mg via INTRAVENOUS
  Filled 2018-03-30: qty 1

## 2018-03-30 MED ORDER — LACTATED RINGERS IV SOLN
INTRAVENOUS | Status: DC
  Administered 2018-03-30 – 2018-03-31 (×4): via INTRAVENOUS

## 2018-03-30 MED ORDER — MISOPROSTOL 50MCG HALF TABLET
50.0000 ug | ORAL_TABLET | ORAL | Status: DC | PRN
  Filled 2018-03-30: qty 1

## 2018-03-30 MED ORDER — OXYTOCIN 40 UNITS IN NORMAL SALINE INFUSION - SIMPLE MED
1.0000 m[IU]/min | INTRAVENOUS | Status: DC
  Administered 2018-03-30 – 2018-03-31 (×2): 2 m[IU]/min via INTRAVENOUS
  Filled 2018-03-30 (×2): qty 1000

## 2018-03-30 MED ORDER — OXYTOCIN 40 UNITS IN NORMAL SALINE INFUSION - SIMPLE MED
2.5000 [IU]/h | INTRAVENOUS | Status: DC
  Administered 2018-03-31: 2.5 [IU]/h via INTRAVENOUS

## 2018-03-30 NOTE — Progress Notes (Signed)
Kelly Morales is a 28 y.o. [id number] at [redacted]w[redacted]d admitted for induction of labor due to Gestational diabetes.  Subjective:  No complaints, feeling some cramping with contractions. Has had one dose of cytotec.  Objective: BP 125/81   Pulse 80   Temp 98.3 F (36.8 C) (Oral)   Resp 16   Ht 5\' 9"  (1.753 m)   Wt 115.8 kg   LMP 06/30/2017 (Exact Date)   BMI 37.72 kg/m  No intake/output data recorded. No intake/output data recorded.  FHT:  FHR: 135 bpm, variability: moderate,  accelerations:  Present,  decelerations:  Absent UC:   irregular, every 10 minutes SVE:   Dilation: Fingertip Effacement (%): Thick Station: Ballotable Exam by:: Arne Cleveland, RN  Labs: Lab Results  Component Value Date   WBC 8.3 03/30/2018   HGB 9.2 (L) 03/30/2018   HCT 30.8 (L) 03/30/2018   MCV 79.6 (L) 03/30/2018   PLT 324 03/30/2018    Assessment / Plan: IOL 2/2 GDM, cervical ripening phase   Labor: latent labor, cervical ripening  Preeclampsia:  NA Fetal Wellbeing:  Category I Pain Control:  Labor support without medications I/D:  n/a Anticipated MOD:  NSVD  Thressa Sheller DNP, CNM  03/30/18  9:56 AM

## 2018-03-30 NOTE — H&P (Addendum)
OBSTETRIC ADMISSION HISTORY AND PHYSICAL  Kelly Morales is a 28 y.o. female [phone] with IUP at 10w0dby LMP presenting for IOL secondary to A2GDM controlled on metformin.   Reports fetal movement. Denies vaginal bleeding and ROM. Patient has mild cramping/discomfort but overall is doing well. No other issues during prenatal course. No issues during previous prenatal courses or deliveries.   [facility]  Support person in labor: FOB and patients father  Ultrasounds . Anatomy U/S: normal  Prenatal History/Complications: . A2GDM  Past Medical History: Past Medical History:  Diagnosis Date  . Anemia   . Gestational diabetes     Past Surgical History: History reviewed. No pertinent surgical history.  Obstetrical History: OB History    Gravida  5   Para  2   Term  2   Preterm      AB  2   Living  2     SAB  1   TAB  1   Ectopic      Multiple      Live Births  2           Social History: Social History   Socioeconomic History  . Marital status: Significant Other    Spouse name: Not on file  . Number of children: Not on file  . Years of education: Not on file  . Highest education level: Not on file  Occupational History  . Not on file  Social Needs  . Financial resource strain: Not on file  . Food insecurity:    Worry: Never true    Inability: Never true  . Transportation needs:    Medical: No    Non-medical: No  Tobacco Use  . Smoking status: Former Smoker    Last attempt to quit: 2019    Years since quitting: 1.0  . Smokeless tobacco: Never Used  Substance and Sexual Activity  . Alcohol use: Not Currently  . Drug use: Never  . Sexual activity: Yes    Birth control/protection: None  Lifestyle  . Physical activity:    Days per week: Not on file    Minutes per session: Not on file  . Stress: Not on file  Relationships  . Social connections:    Talks on phone: Not on file    Gets together: Not on  file    Attends religious service: Not on file    Active member of club or organization: Not on file    Attends meetings of clubs or organizations: Not on file    Relationship status: Not on file  Other Topics Concern  . Not on file  Social History Narrative  . Not on file    Family History: Family History  Problem Relation Age of Onset  . Diabetes Mother   . Hypertension Mother   . Hypertension Maternal Grandmother     Allergies: No Known Allergies  Medications Prior to Admission  Medication Sig Dispense Refill Last Dose  . Elastic Bandages & Supports (COMFORT FIT MATERNITY SUPP MED) MISC Use as directed to help with sciatic pain. (Patient not taking: Reported on 03/11/2018) 1 each 0 Not Taking  . escitalopram (LEXAPRO) 20 MG tablet Take 1 tablet (20 mg total) by mouth daily. 30 tablet 3 Taking  . ferrous sulfate 325 (65 FE) MG tablet Take 325 mg by mouth 2 (two) times daily.   Taking  . glucose blood test strip Use four times a day or as directed. ICD  O24.419. 120 each 12 Taking  . glucose monitoring kit (FREESTYLE) monitoring kit Use as directed to monitor blood glucose. ICD O24.419 1 each 0 Taking  . LANCETS ULTRA THIN 30G MISC Use four times a day or as directed. ICD O24.419 2 each 12 Taking  . metFORMIN (GLUCOPHAGE) 500 MG tablet Take 1 tablet (500 mg total) by mouth 2 (two) times daily with a meal. 60 tablet 0 Taking  . Prenatal Vit-Fe Fumarate-FA (MULTIVITAMIN-PRENATAL) 27-0.8 MG TABS tablet Take 1 tablet by mouth daily at 12 noon.   Taking     Review of Systems  All systems reviewed and negative except as stated in HPI  Blood pressure 118/73, pulse 78, temperature 98.3 F (36.8 C), temperature source Oral, resp. rate 18, height _0  (1.753 m), weight 115.8 kg, last menstrual period 06/30/2017, unknown if currently breastfeeding. General appearance: alert, cooperative and no distress Lungs: no respiratory distress Heart: regular rate  Abdomen: soft, non-tender;  gravid  Extremities: Homans sign is negative, no sign of DVT Presentation: cephalic Fetal monitoring: category 1: 130's, moderate variability, no decelerations, appropriate accelerations Uterine activity: minimal Dilation: Fingertip Effacement (%): Thick Station: Ballotable Exam by:: Domenic Moras, RN  Prenatal labs: ABO, Rh: O/Positive/-- (07/16 1443) Antibody: Negative (07/16 1443) Rubella: 11.30 (07/16 1443) RPR: Non Reactive (11/11 0837)  HBsAg: Negative (07/16 1443)  HIV: Non Reactive (11/11 0837)  GBS:   pos Genetic screening:  normal  Prenatal Transfer Tool  Maternal Diabetes: Yes:  Diabetes Type:  Insulin/Medication controlled Genetic Screening: Normal Maternal Ultrasounds/Referrals: Normal Fetal Ultrasounds or other Referrals:  None Maternal Substance Abuse:  No Significant Maternal Medications:  None Significant Maternal Lab Results: None  Results for orders placed or performed during the hospital encounter of 03/30/18 (from the past 24 hour(s))  CBC   Collection Time: 03/30/18  7:50 AM  Result Value Ref Range   WBC 8.3 4.0 - 10.5 K/uL   RBC 3.87 3.87 - 5.11 MIL/uL   Hemoglobin 9.2 (L) 12.0 - 15.0 g/dL   HCT 30.8 (L) 36.0 - 46.0 %   MCV 79.6 (L) 80.0 - 100.0 fL   MCH 23.8 (L) 26.0 - 34.0 pg   MCHC 29.9 (L) 30.0 - 36.0 g/dL   RDW 18.3 (H) 11.5 - 15.5 %   Platelets 324 150 - 400 K/uL   nRBC 0.0 0.0 - 0.2 %  Glucose, capillary   Collection Time: 03/30/18  8:34 AM  Result Value Ref Range   Glucose-Capillary 83 70 - 99 mg/dL    Patient Active Problem List   Diagnosis Date Noted  . Gestational diabetes 03/30/2018  . GBS bacteriuria 01/22/2018  . Gestational diabetes mellitus (GDM) controlled on oral hypoglycemic drug, antepartum 01/15/2018  . Supervision of high risk pregnancy, antepartum 09/18/2017  . Poor dentition 09/18/2017  . BMI 33.0-33.9,adult 05/25/2017  . Mixed anxiety and depressive disorder 07/24/2014  . Iron deficiency anemia 02/13/2014     Assessment/Plan:  Kelly Morales is a 28 y.o. female [phone] with IUP at 64w0dby LMP presenting for IOL secondary to A2GDM controlled on metformin.   Labor: IOL secondary to A2GDM -- pain control: epidural eventually -likely will start with cytotec and foley bulb   Fetal Wellbeing: EFW 4339g via 1/17 [place] Cephalic by leopold.  -- GBS (+) -- continuous fetal monitoring - see above  Postpartum Planning -- boy(outpatient)/breast/liletta   HZachery Conch DO PGYI Family Medicine     I confirm that I have verified the information documented in the resident's  note and that I have also personally reperformed the physical exam and all medical decision making activities.   Marcille Buffy DNP, CNM  03/30/18  10:18 AM

## 2018-03-30 NOTE — Anesthesia Preprocedure Evaluation (Signed)
Anesthesia Evaluation    Reviewed: Allergy & Precautions, Patient's Chart, lab work & pertinent test results  History of Anesthesia Complications Negative for: history of anesthetic complications  Airway Mallampati: II  TM Distance: >3 FB Neck ROM: Full    Dental  (+) Poor Dentition   Pulmonary former smoker,    Pulmonary exam normal breath sounds clear to auscultation       Cardiovascular negative cardio ROS Normal cardiovascular exam Rhythm:Regular Rate:Normal     Neuro/Psych Anxiety Depression negative neurological ROS     GI/Hepatic negative GI ROS, Neg liver ROS,   Endo/Other  diabetes, Gestational, Oral Hypoglycemic Agents  Renal/GU negative Renal ROS     Musculoskeletal negative musculoskeletal ROS (+)   Abdominal   Peds  Hematology  (+) anemia , Hgb 9.2   Anesthesia Other Findings Day of surgery medications reviewed with the patient.  Reproductive/Obstetrics (+) Pregnancy                             Anesthesia Physical Anesthesia Plan  ASA: II  Anesthesia Plan: Epidural   Post-op Pain Management:    Induction:   PONV Risk Score and Plan: 2 and Treatment may vary due to age or medical condition  Airway Management Planned: Natural Airway  Additional Equipment:   Intra-op Plan:   Post-operative Plan:   Informed Consent: I have reviewed the patients History and Physical, chart, labs and discussed the procedure including the risks, benefits and alternatives for the proposed anesthesia with the patient or authorized representative who has indicated his/her understanding and acceptance.       Plan Discussed with:   Anesthesia Plan Comments:         Anesthesia Quick Evaluation

## 2018-03-30 NOTE — Progress Notes (Addendum)
OB/GYN Faculty Practice: Labor Progress Note  Subjective: Kelly Morales is a 28 y.o. female [phone] with IUP at [redacted]w[redacted]d by LMP presenting for IOL secondary to A2GDM controlled on metformin.   Overall doing well. No pain. Epidural working well.   Objective: BP 120/79   Pulse 79   Temp 99.3 F (37.4 C) (Oral)   Resp 18   Ht 5\' 9"  (1.753 m)   Wt 115.8 kg   LMP 06/30/2017 (Exact Date)   SpO2 99%   BMI 37.72 kg/m  Gen: alert and cooperative  Dilation: 6 Effacement (%): 80 Cervical Position: Posterior Station: -3 Presentation: Vertex Exam by:: Verdie Drown, RN  Assessment and Plan: Kelly Morales is a 28 y.o. female [phone] with IUP at [redacted]w[redacted]d by LMP presenting for IOL secondary to A2GDM controlled on metformin.   Labor: latent -- pain control: epidural  Fetal Well-Being: EFW 4339g via 1/17 [place]. Cephalic by leopold.  -- Category 1 -- GBS +   Rollene Rotunda, DO PGYI Family Medicine  10:24 PM

## 2018-03-30 NOTE — Anesthesia Procedure Notes (Signed)
Epidural Patient location during procedure: OB Start time: 03/30/2018 6:13 PM End time: 03/30/2018 6:15 PM  Staffing Anesthesiologist: Kaylyn Layer, MD Performed: anesthesiologist   Preanesthetic Checklist Completed: patient identified, pre-op evaluation, timeout performed, IV checked, risks and benefits discussed and monitors and equipment checked  Epidural Patient position: sitting Prep: site prepped and draped and DuraPrep Patient monitoring: continuous pulse ox, blood pressure, heart rate and cardiac monitor Approach: midline Location: L3-L4 Injection technique: LOR air  Needle:  Needle type: Tuohy  Needle gauge: 17 G Needle length: 9 cm Needle insertion depth: 7 cm Catheter type: closed end flexible Catheter size: 19 Gauge Catheter at skin depth: 12 cm Test dose: negative and Other (1% lidocaine)  Assessment Events: blood not aspirated, injection not painful, no injection resistance, negative IV test and no paresthesia  Additional Notes Patient identified. Risks, benefits, and alternatives discussed with patient including but not limited to bleeding, infection, nerve damage, paralysis, failed block, incomplete pain control, headache, blood pressure changes, nausea, vomiting, reactions to medication, itching, and postpartum back pain. Confirmed with bedside nurse the patient's most recent platelet count. Confirmed with patient that they are not currently taking any anticoagulation, have any bleeding history, or any family history of bleeding disorders. Patient expressed understanding and wished to proceed. All questions were answered. Sterile technique was used throughout the entire procedure. Crisp LOR on first pass. Please see nursing notes for vital signs. Test dose was given through epidural catheter and negative prior to continuing to dose epidural or start infusion. Warning signs of high block given to the patient including shortness of breath, tingling/numbness in  hands, complete motor block, or any concerning symptoms with instructions to call for help. Patient was given instructions on fall risk and not to get out of bed. All questions and concerns addressed with instructions to call with any issues or inadequate analgesia.  Reason for block:procedure for pain

## 2018-03-30 NOTE — Anesthesia Pain Management Evaluation Note (Signed)
  CRNA Pain Management Visit Note  Patient: Kelly Morales, 28 y.o., female  "Hello I am a member of the anesthesia team at [facility]. We have an anesthesia team available at all times to provide care throughout the hospital, including epidural management and anesthesia for C-section. I don't know your plan for the delivery whether it a natural birth, water birth, IV sedation, nitrous supplementation, doula or epidural, but we want to meet your pain goals."   1.Was your pain managed to your expectations on prior hospitalizations?   Yes   2.What is your expectation for pain management during this hospitalization?     Epidural  3.How can we help you reach that goal? Support as needed  Record the patient's initial score and the patient's pain goal.   Pain: 0  Pain Goal: 4 [facility] wants you to be able to say your pain was always managed very well.  [facility] 03/30/2018

## 2018-03-30 NOTE — Progress Notes (Signed)
Kelly Morales is a 28 y.o. [id number] at [redacted]w[redacted]d admitted for induction of labor due to Gestational diabetes.  Subjective:  No complaints. Not feeling much discomfort at this time.  Objective: BP 112/68   Pulse 73   Temp 99.3 F (37.4 C) (Oral)   Resp 16   Ht 5\' 9"  (1.753 m)   Wt 115.8 kg   LMP 06/30/2017 (Exact Date)   BMI 37.72 kg/m  No intake/output data recorded. No intake/output data recorded.  FHT:  FHR: 140 bpm, variability: moderate,  accelerations:  Present,  decelerations:  Absent UC:   irregular, every 8+ minutes SVE:   Dilation: 1.5 Effacement (%): 50 Station: -3 Exam by:: Zorita Pang   Procedure:   Patient informed of R/B/A of procedure. NST was performed and was reactive prior to procedure. Appropriate time out taken. The patient was placed in the lithotomy position and a cervical exam was performed and a finger was used to guide the 68F foley balloon through the internal os of the cervix. Foley Balloon filled with 60cc of normal saline. Plug inserted into end of the foley. Foley placed on tension and taped to medial thigh.    Labs: Lab Results  Component Value Date   WBC 8.3 03/30/2018   HGB 9.2 (L) 03/30/2018   HCT 30.8 (L) 03/30/2018   MCV 79.6 (L) 03/30/2018   PLT 324 03/30/2018    Assessment / Plan:  IOL. Has had cytotec x 1, FB placed at this time. Will start pitocin   Labor: Progressing normally Preeclampsia:  NA Fetal Wellbeing:  Category I Pain Control:  Labor support without medications I/D:  n/a Anticipated MOD:  NSVD  Thressa Sheller DNP, CNM  03/30/18  12:58 PM

## 2018-03-31 ENCOUNTER — Encounter (HOSPITAL_COMMUNITY): Payer: Self-pay

## 2018-03-31 DIAGNOSIS — O99824 Streptococcus B carrier state complicating childbirth: Secondary | ICD-10-CM

## 2018-03-31 DIAGNOSIS — O24425 Gestational diabetes mellitus in childbirth, controlled by oral hypoglycemic drugs: Secondary | ICD-10-CM

## 2018-03-31 DIAGNOSIS — Z3A39 39 weeks gestation of pregnancy: Secondary | ICD-10-CM

## 2018-03-31 LAB — RPR: RPR Ser Ql: NONREACTIVE

## 2018-03-31 LAB — GLUCOSE, CAPILLARY
GLUCOSE-CAPILLARY: 72 mg/dL (ref 70–99)
Glucose-Capillary: 77 mg/dL (ref 70–99)
Glucose-Capillary: 83 mg/dL (ref 70–99)
Glucose-Capillary: 82 mg/dL (ref 70–99)

## 2018-03-31 MED ORDER — TETANUS-DIPHTH-ACELL PERTUSSIS 5-2.5-18.5 LF-MCG/0.5 IM SUSP: 0.5000 mL | Freq: Once | INTRAMUSCULAR | Status: DC

## 2018-03-31 MED ORDER — SIMETHICONE 80 MG PO CHEW: 80.0000 mg | CHEWABLE_TABLET | ORAL | Status: DC | PRN

## 2018-03-31 MED ORDER — IBUPROFEN 600 MG PO TABS
600.0000 mg | ORAL_TABLET | Freq: Four times a day (QID) | ORAL | Status: DC
  Administered 2018-03-31 – 2018-04-01 (×4): 600 mg via ORAL
  Filled 2018-03-31 (×4): qty 1

## 2018-03-31 MED ORDER — COCONUT OIL OIL
1.0000 "application " | TOPICAL_OIL | Status: DC | PRN
  Administered 2018-04-01: 1 via TOPICAL
  Filled 2018-03-31: qty 120

## 2018-03-31 MED ORDER — ONDANSETRON HCL 4 MG PO TABS: 4.0000 mg | ORAL_TABLET | ORAL | Status: DC | PRN

## 2018-03-31 MED ORDER — WITCH HAZEL-GLYCERIN EX PADS: 1.0000 "application " | MEDICATED_PAD | CUTANEOUS | Status: DC | PRN

## 2018-03-31 MED ORDER — ACETAMINOPHEN 325 MG PO TABS
650.0000 mg | ORAL_TABLET | ORAL | Status: DC | PRN
  Administered 2018-03-31: 650 mg via ORAL
  Filled 2018-03-31 (×2): qty 2

## 2018-03-31 MED ORDER — PRENATAL MULTIVITAMIN CH
1.0000 | ORAL_TABLET | Freq: Every day | ORAL | Status: DC
  Administered 2018-04-01: 1 via ORAL
  Filled 2018-03-31: qty 1

## 2018-03-31 MED ORDER — ONDANSETRON HCL 4 MG/2ML IJ SOLN: 4.0000 mg | INTRAMUSCULAR | Status: DC | PRN

## 2018-03-31 MED ORDER — OXYCODONE HCL 5 MG PO TABS: 5.0000 mg | ORAL_TABLET | ORAL | Status: DC | PRN

## 2018-03-31 MED ORDER — ZOLPIDEM TARTRATE 5 MG PO TABS: 5.0000 mg | ORAL_TABLET | Freq: Every evening | ORAL | Status: DC | PRN

## 2018-03-31 MED ORDER — DIPHENHYDRAMINE HCL 25 MG PO CAPS: 25.0000 mg | ORAL_CAPSULE | Freq: Four times a day (QID) | ORAL | Status: DC | PRN

## 2018-03-31 MED ORDER — DIBUCAINE 1 % RE OINT: 1.0000 "application " | TOPICAL_OINTMENT | RECTAL | Status: DC | PRN

## 2018-03-31 MED ORDER — SENNOSIDES-DOCUSATE SODIUM 8.6-50 MG PO TABS
2.0000 | ORAL_TABLET | ORAL | Status: DC
  Administered 2018-03-31: 2 via ORAL
  Filled 2018-03-31: qty 2

## 2018-03-31 MED ORDER — BENZOCAINE-MENTHOL 20-0.5 % EX AERO
1.0000 "application " | INHALATION_SPRAY | CUTANEOUS | Status: DC | PRN
  Administered 2018-03-31: 1 via TOPICAL
  Filled 2018-03-31: qty 56

## 2018-03-31 NOTE — Progress Notes (Signed)
Resident, Kelly Morales, attempted AROM around 0630. Difficult to AROM, but SROM about 20 minutes later. Continuing pitocin. Will consider IUPC placement with next check to help with titrating pitocin.   Cristal Deer. Earlene Plater, DO OB/GYN Fellow

## 2018-03-31 NOTE — Lactation Note (Addendum)
This note was copied from a baby's chart. Lactation Consultation Note  Patient Name: Kelly Morales [id number] Date: 03/31/2018 Reason for consult: Initial assessment;Term P3, 8 hour female infant, LGA  And mom with GDM in pregnancy. Per mom, infant had one stool since delivery. Per mom, she feels infant latches well but thought she did not have milk in breast. Per mom, infant was formula feed (13 ml) 1 hour prior to [facility] entering room. LC did not see latch at this time. LC discussed hand expression and mom was able to teach back, colostrum is present in both breast and mom was pleased that she had colostrum present to give infant. LC discussed tummy size,  milk production of supply and demand. Per mom, she wants to breastfeed her son longer than previous children, she breastfeed her eldest son less than one month and stopped due low milk supply. Mom is active on [facility] Program in [place]. Mom will call Nurse or LC if she has any further questions, concerns or need assistance with latching infant to breast. Per mom, she had DEBP at home. LC discussed I & O. Reviewed Baby & Me book's Breastfeeding Basics.  Mom made aware of O/P services, breastfeeding support groups, community resources, and our phone # for post-discharge questions.  Mom current feeding plan: 1. Mom will breastfeed according hunger cues and not exceed 3 hours without breastfeeding infant. 2. Mom will breastfeed  Infant first then supplement with formula afterwards according infant age/ hours of life.  Maternal Data Formula Feeding for Exclusion: Yes Reason for exclusion: Mother's choice to formula and breast feed on admission Has patient been taught Hand Expression?: Yes(Colostrum present both breast.) Does the patient have breastfeeding experience prior to this delivery?: Yes  Feeding Feeding Type: Breast Fed  LATCH Score                   Interventions Interventions: Breast feeding basics  reviewed;Hand express;Breast massage;Expressed milk;Position options  Lactation Tools Discussed/Used WIC Program: Yes   Consult Status Consult Status: Follow-up Date: 04/01/18 Follow-up type: In-patient    Danelle Earthly 03/31/2018, 9:27 PM

## 2018-03-31 NOTE — Progress Notes (Signed)
OB/GYN Faculty Practice: Labor Progress Note  Subjective: Kelly Mckearney Arringtonis a 28 y.o.femaleG5P2022 with IUP at [redacted]w[redacted]d by LMPpresenting for IOL secondary to A2GDM controlled on metformin.   Objective: BP (!) 122/59   Pulse 62   Temp 97.7 F (36.5 C) (Oral)   Resp 15   Ht 5\' 9"  (1.753 m)   Wt 115.8 kg   LMP 06/30/2017 (Exact Date)   SpO2 99%   BMI 37.72 kg/m  Gen: alert and cooperative  Cervix: 6.5cm Effacement: 50 Station: -2 Position: posterior Check by: Asher Muir, RN  Assessment and Plan: Kelly Marble Arringtonis a 28 y.o.femaleG5P2022 with IUP at [redacted]w[redacted]d by LMPpresenting for IOL secondary to A2GDM controlled on metformin.   Labor: Latent  -- pain control: epidural --progression largely on effacement, will restart pitocin  -- will need to AROM here in the next couple of hours   Fetal Well-Being:  -- Category 1 -- GBS +   Rollene Rotunda, DO PGYI Family Medicine   4:45 AM

## 2018-03-31 NOTE — Progress Notes (Signed)
   Kelly Morales is a 28 y.o. [id number] at [redacted]w[redacted]d  admitted for induction of labor due to Gestational diabetes.  Subjective:  Feeling lots of pressure in her bottom.   Objective: Vitals:   03/31/18 0750 03/31/18 0800 03/31/18 0830 03/31/18 0900  BP: 112/80 109/73 (!) 96/48 112/61  Pulse: 75 80 67 62  Resp:  16 18 16   Temp:  97.8 F (36.6 C)    TempSrc:  Oral    SpO2:      Weight:      Height:       No intake/output data recorded.  FHT:  FHR: 120 bpm, variability: moderate,  accelerations:  Abscent,  decelerations:  Present toco has been readjusted many times; not picking up contractions; difficult to determine if decelerations are variables or early decelerations.  UC:   irregular, every 2-5 minutes SVE:   Dilation: 7 Effacement (%): 60 Station: -2 Exam by:: Verdie Drown, RN Pitocin @  16 mu/min  Labs: Lab Results  Component Value Date   WBC 8.3 03/30/2018   HGB 9.2 (L) 03/30/2018   HCT 30.8 (L) 03/30/2018   MCV 79.6 (L) 03/30/2018   PLT 324 03/30/2018    Assessment / Plan: Now 8.5 cm, 80; still -2. IUPC placed to better evaluate contraction pattern and decels.  BS WNL.   Labor: now close to complete, hopeful for NSVD.  Fetal Wellbeing:  Category I Pain Control:  Epidural Anticipated MOD:  NSVD  Charlesetta Garibaldi [facility] 03/31/2018, 9:52 AM

## 2018-04-01 LAB — CBC
HCT: 26.3 % — ABNORMAL LOW (ref 36.0–46.0)
Hemoglobin: 8 g/dL — ABNORMAL LOW (ref 12.0–15.0)
MCH: 24.1 pg — ABNORMAL LOW (ref 26.0–34.0)
MCHC: 30.4 g/dL (ref 30.0–36.0)
MCV: 79.2 fL — ABNORMAL LOW (ref 80.0–100.0)
Platelets: 287 10*3/uL (ref 150–400)
RBC: 3.32 MIL/uL — ABNORMAL LOW (ref 3.87–5.11)
RDW: 18.6 % — ABNORMAL HIGH (ref 11.5–15.5)
WBC: 8.4 10*3/uL (ref 4.0–10.5)
nRBC: 0 % (ref 0.0–0.2)

## 2018-04-01 LAB — GLUCOSE, CAPILLARY: Glucose-Capillary: 105 mg/dL — ABNORMAL HIGH (ref 70–99)

## 2018-04-01 MED ORDER — IBUPROFEN 600 MG PO TABS: 600.0000 mg | ORAL_TABLET | Freq: Four times a day (QID) | ORAL | 0 refills | Status: DC

## 2018-04-01 MED ORDER — TETANUS-DIPHTH-ACELL PERTUSSIS 5-2.5-18.5 LF-MCG/0.5 IM SUSP: 0.5000 mL | Freq: Once | INTRAMUSCULAR | 0 refills | Status: AC

## 2018-04-01 NOTE — Lactation Note (Signed)
This note was copied from a baby's chart. Lactation Consultation Note  Patient Name: Kelly Morales NOBSJ'G Date: 04/01/2018 Reason for consult: Follow-up assessment;Term  P3 mother whose infant is now 46 hours old.  Mother has breast feeding experience.  Baby was asleep in bassinet and not showing feeding cues when I arrived.  Mother stated that this baby latches much better than her other children did.  She feels like he is latching well and denies any pain with feeding.    Encouraged to continue feeding 8-12 times/24 hours or sooner if baby shows feeding cues.  She will continue hand expression and feed back any EBM she obtains to baby.  Mother will call for latch assistance as needed.  She has a DEBP for home use.  Father present.   Maternal Data Formula Feeding for Exclusion: No Has patient been taught Hand Expression?: Yes Does the patient have breastfeeding experience prior to this delivery?: Yes  Feeding Feeding Type: Formula Nipple Type: Slow - flow  LATCH Score                   Interventions    Lactation Tools Discussed/Used WIC Program: Yes   Consult Status Consult Status: Follow-up Date: 04/02/18 Follow-up type: In-patient    Raisha Brabender R Yaseen Gilberg 04/01/2018, 12:01 PM

## 2018-04-01 NOTE — Discharge Summary (Signed)
Postpartum Discharge Summary     Patient Name: Kelly Morales DOB: 06-04-1990 MRN: [id number]  Date of admission: 03/30/2018 Delivering Provider: Dollene Cleveland   Date of discharge: 04/01/2018  Admitting diagnosis: 39wks, induction Intrauterine pregnancy: [redacted]w[redacted]d     Secondary diagnosis:  Active Problems:   Gestational diabetes   SVD (spontaneous vaginal delivery)      Discharge diagnosis: Term Pregnancy Delivered                                                                                                Post partum procedures:none  Augmentation: pitocin, cytotec, and foley balloon   Complications: None  Hospital course:  Onset of Labor With Vaginal Delivery     28 y.o. yo [id number] at [redacted]w[redacted]d was admitted in Latent Labor on 03/30/2018. Patient had an uncomplicated labor course as follows:  Membrane Rupture Time/Date: 6:44 AM ,03/31/2018   Intrapartum Procedures: Episiotomy: None [1]                                         Lacerations:  2nd degree [3]  Patient had a delivery of a Viable infant. 03/31/2018  Information for the patient's newborn:  Truman, Wagle [[id number]]  Delivery Method: Vaginal, Spontaneous(Filed from Delivery Summary)    Pateint had an uncomplicated postpartum course.  She is ambulating, tolerating a regular diet, passing flatus, and urinating well. Patient is discharged home in stable condition on 04/01/18.   Magnesium Sulfate recieved: No BMZ received: No  Physical exam  Vitals:   03/31/18 1515 03/31/18 1656 03/31/18 2200 04/01/18 0330  BP: 120/68 127/78 114/62 112/72  Pulse: 63 65 64 78  Resp: 16 16  18   Temp:  98.1 F (36.7 C) 98.4 F (36.9 C) 98 F (36.7 C)  TempSrc:  Oral Oral Oral  SpO2:    100%  Weight:      Height:       General: alert Lochia: appropriate Uterine Fundus: firm Incision: n/a DVT Evaluation: No evidence of DVT seen on physical exam. Labs: Lab Results  Component Value Date   WBC 8.4 04/01/2018    HGB 8.0 (L) 04/01/2018   HCT 26.3 (L) 04/01/2018   MCV 79.2 (L) 04/01/2018   PLT 287 04/01/2018   CMP Latest Ref Rng & Units 12/10/2017  Glucose 70 - 99 mg/dL 657(Q)  BUN 6 - 20 mg/dL 7  Creatinine 4.69 - 6.29 mg/dL 5.28  Sodium 413 - 244 mmol/L 140  Potassium 3.5 - 5.1 mmol/L 3.3(L)  Chloride 98 - 111 mmol/L 108  CO2 22 - 32 mmol/L 25  Calcium 8.9 - 10.3 mg/dL 8.9  Total Protein 6.5 - 8.1 g/dL 6.8  Total Bilirubin 0.3 - 1.2 mg/dL 0.4  Alkaline Phos 38 - 126 U/L 84  AST 15 - 41 U/L 14(L)  ALT 0 - 44 U/L 12    Discharge instruction: per After Visit Summary and "Baby and Me Booklet".  After visit meds:    Diet: routine diet  Activity: Advance as tolerated. Pelvic rest for 6 weeks.   Outpatient follow up:4 weeks Follow up Appt:No future appointments. Follow up Visit: Follow-up Information    [facility] Follow up.   Specialty:  Obstetrics and Gynecology Contact information: [address] [id number] mc [place] [address] [phone]           Please schedule this patient for Postpartum visit in: 4 weeks with the following provider: Any provider For C/S patients schedule nurse incision check in weeks 2 weeks: no High risk pregnancy complicated by: GDM Delivery mode:  SVD Anticipated Birth Control:  IUD PP Procedures needed: 2 hour GTT  Schedule Integrated BH visit: no      Newborn Data: Live born female  Birth Weight: 9 lb 10.9 oz (4390 g) APGAR: 3, 6  Newborn Delivery   Birth date/time:  03/31/2018 12:43:00 Delivery type:  Vaginal, Spontaneous    Anemic: hgb 8.0, begin ferrous sulfate daily  Baby Feeding: bottle/breast Disposition:home with mother   04/01/2018 Sigurd Sos Merikay Lesniewski, Student-MidWife

## 2018-04-01 NOTE — Anesthesia Postprocedure Evaluation (Signed)
Anesthesia Post Note  Patient: Kelly Morales  Procedure(s) Performed: AN AD HOC LABOR EPIDURAL     Patient location during evaluation: Mother Baby Anesthesia Type: Epidural Level of consciousness: awake and alert and oriented Pain management: satisfactory to patient Vital Signs Assessment: post-procedure vital signs reviewed and stable Respiratory status: respiratory function stable Cardiovascular status: stable Postop Assessment: no headache, no backache, epidural receding, patient able to bend at knees, no signs of nausea or vomiting and adequate PO intake Anesthetic complications: no    Last Vitals:  Vitals:   03/31/18 2200 04/01/18 0330  BP: 114/62 112/72  Pulse: 64 78  Resp:  18  Temp: 36.9 C 36.7 C  SpO2:  100%    Last Pain:  Vitals:   04/01/18 0330  TempSrc: Oral  PainSc: 0-No pain   Pain Goal:                   [name]

## 2018-04-01 NOTE — Progress Notes (Signed)
MOB was referred for history of depression/anxiety. * Referral screened out by Clinical Social Worker because none of the following criteria appear to apply: ~ History of anxiety/depression during this pregnancy, or of post-partum depression following prior delivery. ~ Diagnosis of anxiety and/or depression within last 3 years OR * MOB's symptoms currently being treated with medication and/or therapy. Per chart review, patient is currently prescribed/taking Lexapro.   Please contact the Clinical Social Worker if needs arise, by [facility] request, or if MOB scores greater than 9/yes to question 10 on Edinburgh Postpartum Depression Screen.  Celso Sickle, LCSWA Clinical Social Worker [facility] Cell#: [phone]

## 2018-04-01 NOTE — Discharge Instructions (Signed)
Before Baby Comes Home  Once your baby is home with you, things may become a bit hectic as you map out a schedule around your newborn's patterns. Preparing the things you need at home before that time comes is important. Before your baby arrives, make sure you:   Have all the supplies that you will need to care for your baby.   Know where to go if there is an emergency.   Discuss the baby's arrival with other family members.  What supplies will I need?  Having the following supplies ready before your baby arrives will help ensure that you are prepared:  Large items   Crib or bassinet and mattress. Make sure to follow safe sleep recommendations to reduce the risk of sudden infant death syndrome.   Rear-facing infant car seat. Have a trained professional check to make sure that it is installed in your car correctly. Many hospitals and fire departments perform this service free of charge.   Stroller.  Always make sure any products--including cribs, mattresses, bassinets, or portable cribs and play areas--are safe. Check for recalls on your specific brand and model of crib.  Breastfeeding   Nursing pillow.   Milk storage containers or bags.   Nipple cream.   Nursing bra.   Breast pads.   Breast pump.   Breast shields.  Feeding   Formula.   Purified bottled water.   6-8 bottles (4-5 oz bottles and 8-9 oz bottles).   6-8 bottle nipples.   Bibs and burp cloths.   Bottle brush.   Bottle sterilizer (or a pot with a lid).  Bathing   Infant bath basin.   Mild baby soap and baby shampoo.   Soft cloth towel and washcloth.   Hooded towel.  Diapering   Diapers. You may need to use as many as 10-12 diapers each day.   Baby wipes.   Diaper cream.   Petroleum jelly.   Changing pad.   Hand sanitizer.  Health and safety   Rectal thermometer.   Infant medicines.   Bulb syringe.   Baby nail clippers.   Baby monitor.   2-3 pacifiers, if desired.  Sleeping   Sleep sack or swaddling blanket.   Firm  mattress pad and fitted sheets for the crib or bassinet.  Other supplies   Diaper bag.   Clothing, including one-piece outfits and pajamas.   Receiving blankets.  Follow these instructions at home:  Preparing for an emergency  Prepare for an emergency by taking these steps:   Know when to seek care or call your health care provider.   Know how to get to the nearest hospital.   List the phone numbers of your baby's health care providers near your home phone and in your cell phone.   Take an infant first aid and CPR class.   Place the phone number for the poison control center on your refrigerator.   If there will be caregivers in the home, make sure your phone number, emergency contacts, and address are placed on the refrigerator in case they need to be given to emergency services.  Preparing your family     Create a plan for visitors. Keep your baby away from people who have a cough, fever, or other symptoms of illness.   Prepare freezer meals ahead of time, and ask friends and family to help with meal preparation, errands, and everyday tasks.   If you have other children:  ? Talk with them about the baby coming   home. Ask them how they feel about it.  ? Read a book together about being a new big brother or sister.  ? Find ways to let them help you prepare for the new baby.  ? Have someone ready to care for them while you are in the hospital.  Where to find more information   Consumer Product Safety Commission: www.cpsc.gov   American Academy of Pediatrics: www.healthychildren.org   Safe Kids Worldwide: www.safekids.org  Summary   Planning is important before bringing your baby home from the hospital. You will need to have certain supplies ready before your baby arrives.   You will need to have a rear-facing infant car seat ready prior to bringing your baby home. Have a trained professional check to make sure that it is installed in your car correctly.   Always make sure any products--including  cribs, mattresses, bassinets, or portable cribs and play areas--are safe. Check for recalls on your specific brand and model of crib.   Know when to seek care or call your health care provider, and know how to get to the nearest hospital.  This information is not intended to replace advice given to you by your health care provider. Make sure you discuss any questions you have with your health care provider.  Document Released: 02/03/2008 Document Revised: 01/10/2017 Document Reviewed: 01/10/2017  Elsevier Interactive Patient Education  2019 Elsevier Inc.

## 2018-04-05 ENCOUNTER — Telehealth: Payer: Self-pay | Admitting: Family Medicine

## 2018-04-05 NOTE — Telephone Encounter (Signed)
Attempted to call pt w/ her pp visit and 2 hr gtt appts. No answer, Left detailed message about these appts and to call the office if she needed to reschedule. Reminder mailed

## 2018-04-06 ENCOUNTER — Inpatient Hospital Stay (HOSPITAL_COMMUNITY): Admit: 2018-04-06 | Payer: Self-pay

## 2018-05-01 ENCOUNTER — Other Ambulatory Visit: Payer: Self-pay

## 2018-05-01 MED ORDER — HYDROCORTISONE 2.5 % RE CREA: 1.0000 "application " | TOPICAL_CREAM | Freq: Two times a day (BID) | RECTAL | 0 refills | Status: DC

## 2018-05-13 ENCOUNTER — Other Ambulatory Visit: Payer: Self-pay

## 2018-05-13 ENCOUNTER — Other Ambulatory Visit: Payer: Self-pay | Admitting: *Deleted

## 2018-05-13 DIAGNOSIS — O24439 Gestational diabetes mellitus in the puerperium, unspecified control: Secondary | ICD-10-CM

## 2018-05-14 ENCOUNTER — Ambulatory Visit: Payer: Self-pay | Admitting: Student

## 2018-05-14 ENCOUNTER — Other Ambulatory Visit: Payer: Self-pay

## 2018-05-24 ENCOUNTER — Encounter: Payer: Self-pay | Admitting: *Deleted

## 2018-07-05 ENCOUNTER — Telehealth: Payer: Self-pay | Admitting: Obstetrics and Gynecology

## 2018-07-05 NOTE — Telephone Encounter (Signed)
The patient called in to schedule an appointment for birth control. She stated [facility] She also stated they were supposed to complete the IUD yesterday but she missed her appointment. She called to schedule with our clinic.

## 2018-07-15 ENCOUNTER — Other Ambulatory Visit: Payer: Self-pay

## 2018-07-15 ENCOUNTER — Encounter: Payer: Self-pay | Admitting: Obstetrics & Gynecology

## 2018-07-15 ENCOUNTER — Telehealth: Payer: Self-pay | Admitting: Obstetrics & Gynecology

## 2018-07-15 ENCOUNTER — Ambulatory Visit (INDEPENDENT_AMBULATORY_CARE_PROVIDER_SITE_OTHER): Payer: Medicaid Other | Admitting: Obstetrics & Gynecology

## 2018-07-15 VITALS — BP 134/78 | HR 69 | Ht 69.0 in | Wt 243.0 lb

## 2018-07-15 DIAGNOSIS — Z30430 Encounter for insertion of intrauterine contraceptive device: Secondary | ICD-10-CM | POA: Diagnosis not present

## 2018-07-15 DIAGNOSIS — E669 Obesity, unspecified: Secondary | ICD-10-CM | POA: Insufficient documentation

## 2018-07-15 DIAGNOSIS — O24415 Gestational diabetes mellitus in pregnancy, controlled by oral hypoglycemic drugs: Secondary | ICD-10-CM

## 2018-07-15 DIAGNOSIS — Z3202 Encounter for pregnancy test, result negative: Secondary | ICD-10-CM

## 2018-07-15 MED ORDER — LEVONORGESTREL 19.5 MCG/DAY IU IUD
INTRAUTERINE_SYSTEM | Freq: Once | INTRAUTERINE | Status: AC
  Administered 2018-07-15: 1 via INTRAUTERINE

## 2018-07-15 NOTE — Telephone Encounter (Signed)
Called the patient to confirm the appointment, left a detailed voicemail message with our new location and appointment time. °

## 2018-07-15 NOTE — Progress Notes (Signed)
   Subjective:    Patient ID: Kelly Morales, female    DOB: 1990-05-22, 28 y.o.   MRN: [id number]  HPI 28 yo P3 here for Liletta insertion. She has been using withdrawal for contraception, has not had sex for the last 3 weeks.   Review of Systems Pap 7/19 normal    Objective:   Physical Exam Breathing, conversing, and ambulating normally Well nourished, well hydrated Black female, no apparent distress UPT negative, consent signed, Time out procedure done. Cervix prepped with betadine and grasped with a single tooth tenaculum. Liletta was easily placed and the strings were cut to 3-4 cm. Uterus sounded to 9 cm. She tolerated the procedure well.     Assessment & Plan:  Contraception- Liletta Rec back up method for 2 weeks Come back 4 weeks for string check and 2 hour GTT (had GDM)

## 2018-07-15 NOTE — Addendum Note (Signed)
Addended by: Kathee Delton on: 07/15/2018 04:02 PM   Modules accepted: Orders

## 2018-07-26 DIAGNOSIS — F331 Major depressive disorder, recurrent, moderate: Secondary | ICD-10-CM | POA: Diagnosis not present

## 2018-07-30 ENCOUNTER — Telehealth: Payer: Self-pay | Admitting: Student

## 2018-07-30 NOTE — Telephone Encounter (Signed)
Called the patient to inform of the upcoming visit. Left a detailed voicemail.

## 2018-07-31 ENCOUNTER — Other Ambulatory Visit: Payer: Medicaid Other

## 2018-07-31 ENCOUNTER — Ambulatory Visit: Payer: Medicaid Other | Admitting: Obstetrics & Gynecology

## 2018-07-31 ENCOUNTER — Other Ambulatory Visit: Payer: Self-pay | Admitting: *Deleted

## 2018-07-31 DIAGNOSIS — O24439 Gestational diabetes mellitus in the puerperium, unspecified control: Secondary | ICD-10-CM

## 2018-08-02 DIAGNOSIS — F331 Major depressive disorder, recurrent, moderate: Secondary | ICD-10-CM | POA: Diagnosis not present

## 2018-08-03 DIAGNOSIS — F331 Major depressive disorder, recurrent, moderate: Secondary | ICD-10-CM | POA: Diagnosis not present

## 2018-08-08 DIAGNOSIS — F331 Major depressive disorder, recurrent, moderate: Secondary | ICD-10-CM | POA: Diagnosis not present

## 2018-08-14 DIAGNOSIS — F331 Major depressive disorder, recurrent, moderate: Secondary | ICD-10-CM | POA: Diagnosis not present

## 2018-08-17 ENCOUNTER — Other Ambulatory Visit: Payer: Self-pay | Admitting: Family Medicine

## 2018-08-21 DIAGNOSIS — F331 Major depressive disorder, recurrent, moderate: Secondary | ICD-10-CM | POA: Diagnosis not present

## 2018-08-28 DIAGNOSIS — F331 Major depressive disorder, recurrent, moderate: Secondary | ICD-10-CM | POA: Diagnosis not present

## 2018-09-03 ENCOUNTER — Other Ambulatory Visit: Payer: Self-pay | Admitting: Family Medicine

## 2018-09-11 DIAGNOSIS — F331 Major depressive disorder, recurrent, moderate: Secondary | ICD-10-CM | POA: Diagnosis not present

## 2018-09-18 DIAGNOSIS — F331 Major depressive disorder, recurrent, moderate: Secondary | ICD-10-CM | POA: Diagnosis not present

## 2018-09-25 DIAGNOSIS — F331 Major depressive disorder, recurrent, moderate: Secondary | ICD-10-CM | POA: Diagnosis not present

## 2018-10-02 DIAGNOSIS — F331 Major depressive disorder, recurrent, moderate: Secondary | ICD-10-CM | POA: Diagnosis not present

## 2018-10-09 DIAGNOSIS — F331 Major depressive disorder, recurrent, moderate: Secondary | ICD-10-CM | POA: Diagnosis not present

## 2018-10-30 ENCOUNTER — Other Ambulatory Visit: Payer: Self-pay | Admitting: Family Medicine

## 2018-11-08 ENCOUNTER — Ambulatory Visit: Payer: Medicaid Other | Admitting: Family Medicine

## 2018-11-20 DIAGNOSIS — F331 Major depressive disorder, recurrent, moderate: Secondary | ICD-10-CM | POA: Diagnosis not present

## 2018-12-04 DIAGNOSIS — F331 Major depressive disorder, recurrent, moderate: Secondary | ICD-10-CM | POA: Diagnosis not present

## 2019-03-18 ENCOUNTER — Telehealth: Payer: Self-pay

## 2019-03-18 NOTE — Telephone Encounter (Signed)
Called pt. LVM to call our office before appt to tomorrow. If pt calls, please ask COVID screening questions. Naevia Unterreiner T Flynn Gwyn, CMA  

## 2019-03-19 ENCOUNTER — Ambulatory Visit: Payer: Medicaid Other | Admitting: Family Medicine

## 2019-04-01 ENCOUNTER — Encounter: Payer: Self-pay | Admitting: Obstetrics & Gynecology

## 2019-04-01 ENCOUNTER — Ambulatory Visit: Payer: Medicaid Other | Admitting: Obstetrics & Gynecology

## 2019-04-03 ENCOUNTER — Ambulatory Visit (INDEPENDENT_AMBULATORY_CARE_PROVIDER_SITE_OTHER): Payer: Medicaid Other | Admitting: Critical Care Medicine

## 2019-04-03 ENCOUNTER — Other Ambulatory Visit: Payer: Self-pay

## 2019-04-03 DIAGNOSIS — Z87891 Personal history of nicotine dependence: Secondary | ICD-10-CM | POA: Insufficient documentation

## 2019-04-03 DIAGNOSIS — O24415 Gestational diabetes mellitus in pregnancy, controlled by oral hypoglycemic drugs: Secondary | ICD-10-CM | POA: Diagnosis not present

## 2019-04-03 DIAGNOSIS — F418 Other specified anxiety disorders: Secondary | ICD-10-CM | POA: Diagnosis not present

## 2019-04-03 DIAGNOSIS — E66812 Obesity, class 2: Secondary | ICD-10-CM

## 2019-04-03 DIAGNOSIS — F17200 Nicotine dependence, unspecified, uncomplicated: Secondary | ICD-10-CM

## 2019-04-03 DIAGNOSIS — D500 Iron deficiency anemia secondary to blood loss (chronic): Secondary | ICD-10-CM | POA: Diagnosis not present

## 2019-04-03 DIAGNOSIS — Z6835 Body mass index (BMI) 35.0-35.9, adult: Secondary | ICD-10-CM

## 2019-04-03 MED ORDER — NICOTINE POLACRILEX 2 MG MT LOZG: 2.0000 mg | LOZENGE | OROMUCOSAL | 0 refills | Status: DC | PRN

## 2019-04-03 MED ORDER — ESCITALOPRAM OXALATE 20 MG PO TABS: 40.0000 mg | ORAL_TABLET | Freq: Every day | ORAL | 1 refills | Status: DC

## 2019-04-03 NOTE — Assessment & Plan Note (Signed)
History of gestational diabetes will need an assessment now that she is postpartum of her glycemic control
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
## 2019-04-03 NOTE — Assessment & Plan Note (Signed)
  .   Current smoking consumption amount: Currently smoking 6 cigarettes a day  . Dicsussion on advise to quit smoking and smoking impacts: We discussed long-term health effects on her lungs and cardiovascular system  . Patient's willingness to quit: The patient is using nicotine to help control stress factors and she is willing to quit  . Methods to quit smoking discussed: We discussed various options including nicotine replacement therapy  . Medication management of smoking session drugs discussed: Recommending nicotine lozenges 2 mg 3 times daily as needed    . Setting quit date quit date is not yet set  . Follow-up arranged follow-up in the office in the next 3 to 4 weeks   Time spent counseling the patient:

## 2019-04-03 NOTE — Progress Notes (Signed)
Patient verified DOB Patient states Lexapro is not providing any assistance. Patient takes at night time.

## 2019-04-03 NOTE — Assessment & Plan Note (Signed)
History of heavy menses and is not currently taking iron supplements and was anemic at the last blood draw a year ago  We will check iron levels and also CBC before the next office visit

## 2019-04-03 NOTE — Patient Instructions (Signed)
Follow this diet   Obesity, Adult Obesity is having too much body fat. Being obese means that your weight is more than what is healthy for you. BMI is a number that explains how much body fat you have. If you have a BMI of 30 or more, you are obese. Obesity is often caused by eating or drinking more calories than your body uses. Changing your lifestyle can help you lose weight. Obesity can cause serious health problems, such as:  Stroke.  Coronary artery disease (CAD).  Type 2 diabetes.  Some types of cancer, including cancers of the colon, breast, uterus, and gallbladder.  Osteoarthritis.  High blood pressure (hypertension).  High cholesterol.  Sleep apnea.  Gallbladder stones.  Infertility problems. What are the causes?  Eating meals each day that are high in calories, sugar, and fat.  Being born with genes that may make you more likely to become obese.  Having a medical condition that causes obesity.  Taking certain medicines.  Sitting a lot (having a sedentary lifestyle).  Not getting enough sleep.  Drinking a lot of drinks that have sugar in them. What increases the risk?  Having a family history of obesity.  Being an [place] American woman.  Being a Hispanic man.  Living in an area with limited access to: ? [place] [facility], recreation centers, or sidewalks. ? Healthy food choices, such as grocery stores and farmers' markets. What are the signs or symptoms? The main sign is having too much body fat. How is this treated?  Treatment for this condition often includes changing your lifestyle. Treatment may include: ? Changing your diet. This may include making a healthy meal plan. ? Exercise. This may include activity that causes your heart to beat faster (aerobic exercise) and strength training. Work with your doctor to design a program that works for you. ? Medicine to help you lose weight. This may be used if you are not able to lose 1 pound a week after 6 weeks  of healthy eating and more exercise. ? Treating conditions that cause the obesity. ? Surgery. Options may include gastric banding and gastric bypass. This may be done if:  Other treatments have not helped to improve your condition.  You have a BMI of 40 or higher.  You have life-threatening health problems related to obesity. Follow these instructions at home: Eating and drinking   Follow advice from your doctor about what to eat and drink. Your doctor may tell you to: ? Limit fast food, sweets, and processed snack foods. ? Choose low-fat options. For example, choose low-fat milk instead of whole milk. ? Eat 5 or more servings of fruits or vegetables each day. ? Eat at home more often. This gives you more control over what you eat. ? Choose healthy foods when you eat out. ? Learn to read food labels. This will help you learn how much food is in 1 serving. ? Keep low-fat snacks available. ? Avoid drinks that have a lot of sugar in them. These include soda, fruit juice, iced tea with sugar, and flavored milk.  Drink enough water to keep your pee (urine) pale yellow.  Do not go on fad diets. Physical activity  Exercise often, as told by your doctor. Most adults should get up to 150 minutes of moderate-intensity exercise every week.Ask your doctor: ? What types of exercise are safe for you. ? How often you should exercise.  Warm up and stretch before being active.  Do slow stretching after being active (  cool down).  Rest between times of being active. Lifestyle  Work with your doctor and a food expert (dietitian) to set a weight-loss goal that is best for you.  Limit your screen time.  Find ways to reward yourself that do not involve food.  Do not drink alcohol if: ? Your doctor tells you not to drink. ? You are pregnant, may be pregnant, or are planning to become pregnant.  If you drink alcohol: ? Limit how much you use to:  0-1 drink a day for women.  0-2 drinks a  day for men. ? Be aware of how much alcohol is in your drink. In the [place] one drink equals one 12 oz bottle of beer (355 mL), one 5 oz glass of wine (148 mL), or one 1 oz glass of hard liquor (44 mL). General instructions  Keep a weight-loss journal. This can help you keep track of: ? The food that you eat. ? How much exercise you get.  Take over-the-counter and prescription medicines only as told by your doctor.  Take vitamins and supplements only as told by your doctor.  Think about joining a support group.  Keep all follow-up visits as told by your doctor. This is important. Contact a doctor if:  You cannot meet your weight loss goal after you have changed your diet and lifestyle for 6 weeks. Get help right away if you:  Are having trouble breathing.  Are having thoughts of harming yourself. Summary  Obesity is having too much body fat.  Being obese means that your weight is more than what is healthy for you.  Work with your doctor to set a weight-loss goal.  Get regular exercise as told by your doctor. This information is not intended to replace advice given to you by your health care provider. Make sure you discuss any questions you have with your health care provider. Document Revised: 10/25/2017 Document Reviewed: 10/25/2017 Elsevier Patient Education  2020 [employer].

## 2019-04-03 NOTE — Assessment & Plan Note (Signed)
The patient's PHQ-9 score is very high at 17  I plan to increase the patient's Lexapro to 40 mg daily and have her meet with our licensed clinical social worker Jenel Lucks

## 2019-04-03 NOTE — Assessment & Plan Note (Signed)
Obesity with a BMI of 36  Dietary control measures were given to the patient through the MyChart and we will see the patient back in return follow-up for labs

## 2019-04-04 ENCOUNTER — Other Ambulatory Visit: Payer: Medicaid Other

## 2019-04-04 DIAGNOSIS — F418 Other specified anxiety disorders: Secondary | ICD-10-CM | POA: Diagnosis not present

## 2019-04-04 DIAGNOSIS — D500 Iron deficiency anemia secondary to blood loss (chronic): Secondary | ICD-10-CM | POA: Diagnosis not present

## 2019-04-04 DIAGNOSIS — O24415 Gestational diabetes mellitus in pregnancy, controlled by oral hypoglycemic drugs: Secondary | ICD-10-CM

## 2019-04-04 NOTE — Progress Notes (Signed)
Patient here for labs ordered during recent telehealth visit. 

## 2019-04-05 LAB — CBC WITH DIFFERENTIAL/PLATELET
Basophils Absolute: 0 10*3/uL (ref 0.0–0.2)
Basos: 1 %
EOS (ABSOLUTE): 0.2 10*3/uL (ref 0.0–0.4)
Eos: 4 %
Hematocrit: 39.4 % (ref 34.0–46.6)
Hemoglobin: 12.1 g/dL (ref 11.1–15.9)
Immature Grans (Abs): 0 10*3/uL (ref 0.0–0.1)
Immature Granulocytes: 0 %
Lymphocytes Absolute: 2.3 10*3/uL (ref 0.7–3.1)
Lymphs: 43 %
MCH: 27.3 pg (ref 26.6–33.0)
MCHC: 30.7 g/dL — ABNORMAL LOW (ref 31.5–35.7)
MCV: 89 fL (ref 79–97)
Monocytes Absolute: 0.3 10*3/uL (ref 0.1–0.9)
Monocytes: 5 %
Neutrophils Absolute: 2.5 10*3/uL (ref 1.4–7.0)
Neutrophils: 47 %
Platelets: 314 10*3/uL (ref 150–450)
RBC: 4.44 x10E6/uL (ref 3.77–5.28)
RDW: 16.8 % — ABNORMAL HIGH (ref 11.7–15.4)
WBC: 5.3 10*3/uL (ref 3.4–10.8)

## 2019-04-05 LAB — THYROID PANEL WITH TSH
Free Thyroxine Index: 1.6 (ref 1.2–4.9)
T3 Uptake Ratio: 23 % — ABNORMAL LOW (ref 24–39)
T4, Total: 6.9 ug/dL (ref 4.5–12.0)
TSH: 1.34 u[IU]/mL (ref 0.450–4.500)

## 2019-04-05 LAB — COMPREHENSIVE METABOLIC PANEL
ALT: 16 IU/L (ref 0–32)
AST: 14 IU/L (ref 0–40)
Albumin/Globulin Ratio: 1.4 (ref 1.2–2.2)
Albumin: 3.9 g/dL (ref 3.9–5.0)
Alkaline Phosphatase: 92 IU/L (ref 39–117)
BUN/Creatinine Ratio: 15 (ref 9–23)
BUN: 9 mg/dL (ref 6–20)
Bilirubin Total: 0.4 mg/dL (ref 0.0–1.2)
CO2: 26 mmol/L (ref 20–29)
Calcium: 9.4 mg/dL (ref 8.7–10.2)
Chloride: 105 mmol/L (ref 96–106)
Creatinine, Ser: 0.6 mg/dL (ref 0.57–1.00)
GFR calc Af Amer: 143 mL/min/{1.73_m2} (ref >59)
GFR calc non Af Amer: 124 mL/min/{1.73_m2} (ref >59)
Globulin, Total: 2.8 g/dL (ref 1.5–4.5)
Glucose: 105 mg/dL — ABNORMAL HIGH (ref 65–99)
Potassium: 3.8 mmol/L (ref 3.5–5.2)
Sodium: 141 mmol/L (ref 134–144)
Total Protein: 6.7 g/dL (ref 6.0–8.5)

## 2019-04-05 LAB — VITAMIN D 25 HYDROXY (VIT D DEFICIENCY, FRACTURES): Vit D, 25-Hydroxy: 14.9 ng/mL — ABNORMAL LOW (ref 30.0–100.0)

## 2019-04-05 LAB — HEMOGLOBIN A1C
Est. average glucose Bld gHb Est-mCnc: 114 mg/dL
Hgb A1c MFr Bld: 5.6 % (ref 4.8–5.6)

## 2019-04-08 ENCOUNTER — Ambulatory Visit (INDEPENDENT_AMBULATORY_CARE_PROVIDER_SITE_OTHER): Payer: Self-pay | Admitting: Licensed Clinical Social Worker

## 2019-04-08 DIAGNOSIS — F418 Other specified anxiety disorders: Secondary | ICD-10-CM

## 2019-04-16 ENCOUNTER — Telehealth: Payer: Self-pay

## 2019-04-16 NOTE — Telephone Encounter (Signed)
Called patient to do their pre-visit COVID screening.  Call went to voicemail. Unable to do prescreening.  

## 2019-04-17 ENCOUNTER — Encounter: Payer: Self-pay | Admitting: Internal Medicine

## 2019-04-17 ENCOUNTER — Ambulatory Visit (INDEPENDENT_AMBULATORY_CARE_PROVIDER_SITE_OTHER): Payer: Medicaid Other | Admitting: Internal Medicine

## 2019-04-17 ENCOUNTER — Other Ambulatory Visit: Payer: Self-pay

## 2019-04-17 VITALS — BP 113/74 | HR 80 | Temp 97.3°F | Resp 17 | Wt 254.0 lb

## 2019-04-17 DIAGNOSIS — F418 Other specified anxiety disorders: Secondary | ICD-10-CM

## 2019-04-17 NOTE — Patient Instructions (Signed)
It was a pleasure to meet you today. I am very impressed by all that you have taken on and the roles/responsibilities that you balance. Try to carve out some time just for yourself if possible to do something that you enjoy and that relaxes you. I am glad you have made the decision to start counseling with [facility]. We will leave your medication as is for now and touch base soon.   Take Care,  Dr. Earlene Plater
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
## 2019-04-18 NOTE — BH Specialist Note (Signed)
Integrated Behavioral Health Visit via Telemedicine (Telephone)  04/08/19 [name] [id number]   Session Start time: 4:00 PM  Session End time: 4:30 PM Total time: 30  Referring Provider: Dr. Earlene Plater Type of Visit: Telephonic Patient location: Home [facility] Provider location: Office All persons participating in visit: LCSW and Patient  Confirmed patient's address: Yes  Confirmed patient's phone number: Yes  Any changes to demographics: No   Confirmed patient's insurance: Yes  Any changes to patient's insurance: No   Discussed confidentiality: Yes    The following statements were read to the patient and/or legal guardian that are established with the [facility] Provider.  "The purpose of this phone visit is to provide behavioral health care while limiting exposure to the coronavirus (COVID19).  There is a possibility of technology failure and discussed alternative modes of communication if that failure occurs."  "By engaging in this telephone visit, you consent to the provision of healthcare.  Additionally, you authorize for your insurance to be billed for the services provided during this telephone visit."   Patient and/or legal guardian consented to telephone visit: Yes   PRESENTING CONCERNS: Patient and/or family reports the following symptoms/concerns: Pt reports difficulty managing depression and anxiety symptoms triggered by psychosocial stressors Duration of problem: Ongoing; Severity of problem: moderate  STRENGTHS (Protective Factors/Coping Skills): Pt receives support from famiy Pt is open to brief therapy Pt has good insight  GOALS ADDRESSED: Patient will: 1.  Reduce symptoms of: anxiety and depression  2.  Increase knowledge and/or ability of: coping skills  3.  Demonstrate ability to: Increase healthy adjustment to current life circumstances and Increase adequate support systems for patient/family  INTERVENTIONS: Interventions utilized:   Solution-Focused Strategies, Supportive Counseling and Psychoeducation and/or Health Education Standardized Assessments completed: Not Needed  ASSESSMENT: Patient currently experiencing depression and anxiety symptoms triggered by psychosocial stressors.   Patient may benefit from brief therapy and continued medication management. LCSW discussed correlation between one's physical and mental health, in addition, to how stress negatively impacts both. Healthy coping skills discussed  PLAN: 1. Follow up with behavioral health clinician on : 04/22/19 2. Behavioral recommendations: Continue with medication management and utilize strategies discussed 3. Referral(s): Integrated [employer] (In Clinic)  Bridgett Larsson, [place] 04/18/19 3:08 PM

## 2019-04-22 ENCOUNTER — Ambulatory Visit: Payer: Medicaid Other | Admitting: Licensed Clinical Social Worker

## 2019-05-27 ENCOUNTER — Telehealth: Payer: Self-pay | Admitting: Licensed Clinical Social Worker

## 2019-05-27 ENCOUNTER — Ambulatory Visit: Payer: Medicaid Other | Admitting: Licensed Clinical Social Worker

## 2019-05-27 NOTE — Telephone Encounter (Signed)
Call placed to patient regarding scheduled IBH appointment. LCSW left message requesting a return call.  

## 2019-06-03 ENCOUNTER — Encounter: Payer: Self-pay | Admitting: Internal Medicine

## 2019-06-03 ENCOUNTER — Ambulatory Visit (INDEPENDENT_AMBULATORY_CARE_PROVIDER_SITE_OTHER): Payer: Medicaid Other | Admitting: Internal Medicine

## 2019-06-03 DIAGNOSIS — L700 Acne vulgaris: Secondary | ICD-10-CM | POA: Diagnosis not present

## 2019-06-03 DIAGNOSIS — F418 Other specified anxiety disorders: Secondary | ICD-10-CM

## 2019-06-03 MED ORDER — TRETINOIN 0.05 % EX GEL: 1.0000 "application " | Freq: Every day | CUTANEOUS | 2 refills | Status: DC

## 2019-06-03 NOTE — Progress Notes (Signed)
Virtual Visit via Telephone Note  I connected with Kelly Morales, on 06/03/2019 at 1:32 PM by telephone due to the COVID-19 pandemic and verified that I am speaking with the correct person using two identifiers.   Consent: I discussed the limitations, risks, security and privacy concerns of performing an evaluation and management service by telephone and the availability of in person appointments. I also discussed with the patient that there may be a patient responsible charge related to this service. The patient expressed understanding and agreed to proceed.   Location of Patient: Home   Location of Provider: Clinic    Persons participating in Telemedicine visit: Kelly Morales Dr. Earlene Plater      History of Present Illness: Patient has a visit to follow up on anxiety and depression. She reports she is doing well with her medications, no side effects. Reports she sometimes misses a day or two and she notices the difference in her mood when she misses doses. When she takes them consistently, she feels like the edge is taken off with the anxiety.   She reports she had trouble connecting with Kelly Lucks, LCSW. She would still like to touch base with her.   She did try the short distance driving without the kids in the car. She reports it was "hot and cold". Wants to keep working on it. Thinks if she can get comfortable it will take that edge of.  Additionally, has some concerns about IUD. Has been in place since May 2020. She reports it has taken a toll on her skin. Every other week she is having break outs on her chin. Thinks stress and diet habits may also be contributing. Was not previously on any type of hormonal birth control. She has been trying OTC face washes, topical ointments.   GAD 7 : Generalized Anxiety Score 06/03/2019 04/17/2019 04/03/2019 03/20/2018  Nervous, Anxious, on Edge 1 1 1  0  Control/stop worrying 1 2 1 1   Worry too much -  different things 2 2 1 1   Trouble relaxing 2 2 1 1   Restless 1 0 1 0  Easily annoyed or irritable 2 2 1 1   Afraid - awful might happen 0 0 0 1  Total GAD 7 Score 9 9 6 5    Depression screen [facility] 2/9 06/03/2019 04/17/2019 04/03/2019  Decreased Interest 1 1 1   Down, Depressed, Hopeless 1 2 1   PHQ - 2 Score 2 3 2   Altered sleeping 2 1 3   Tired, decreased energy 1 3 3   Change in appetite 2 3 3   Feeling bad or failure about yourself  2 2 3   Trouble concentrating 0 0 3  Moving slowly or fidgety/restless 0 0 0  Suicidal thoughts 0 0 0  PHQ-9 Score 9 12 17   Difficult doing work/chores - - Somewhat difficult       Past Medical History:  Diagnosis Date  . Anemia   . Gestational diabetes   . Gestational diabetes mellitus (GDM) controlled on oral hypoglycemic drug, antepartum 01/15/2018   Diagnosis: 2-hr glucola at 28-weeks (abrnomal fasting and 1-hr) Current Diabetic Medications:  None  [ ]  Aspirin 81 mg daily after 12 weeks (? A2/B GDM)  Required Referrals for A1GDM or A2GDM: [ ]  Diabetes Education and Testing Supplies - Rx sent  [ ]  Nutrition Consult - message sent   For A2/B GDM or higher classes of DM [ ]  Diabetes Education and Testing Supplies [ ]  Nutrition Counsult [ ]  Fetal  No Known Allergies  Current Outpatient Medications on File Prior to Visit  Medication Sig Dispense Refill  . escitalopram (LEXAPRO) 20 MG tablet Take 2 tablets (40 mg total) by mouth daily. 60 tablet 1  . levonorgestrel (LILETTA, 52 MG,) 19.5 MCG/DAY IUD IUD 1 each by Intrauterine route once.     No current facility-administered medications on file prior to visit.    Observations/Objective: NAD. Speaking clearly.  Work of breathing normal.  Alert and oriented. Mood appropriate.   Assessment and Plan: 1. Acne vulgaris Patient has tried several OTC acne medications and regimens. Will trial prescription tretinoin. Counseled on skin irritation/sensitivity potential when first using product. Also discussed  potential photosensitivity.Counseled on appropriate skin regimens and brands OTC I would recommend that are non-irritating.  - tretinoin (ALTRALIN) 0.05 % gel; Apply 1 application topically at bedtime.  Dispense: 45 g; Refill: 2  2. Mixed anxiety and depressive disorder Improvement in PHQ-9 score. GAD-7 score remains the same. Patient reports she is feeling better after increase in Lexapro in Jan. Will continue current regimen. She wishes to follow up with [facility], this was scheduled.    Follow Up Instructions: F/u with Christa See scheduled 4/6    I discussed the assessment and treatment plan with the patient. The patient was provided an opportunity to ask questions and all were answered. The patient agreed with the plan and demonstrated an understanding of the instructions.   The patient was advised to call back or seek an in-person evaluation if the symptoms worsen or if the condition fails to improve as anticipated.     I provided 18 minutes total of non-face-to-face time during this encounter including median intraservice time, reviewing previous notes, investigations, ordering medications, medical decision making, coordinating care and patient verbalized understanding at the end of the visit.    Phill Myron, D.O. Primary Care at [facility]  06/03/2019, 1:32 PM

## 2019-06-04 ENCOUNTER — Other Ambulatory Visit: Payer: Self-pay | Admitting: Internal Medicine

## 2019-06-04 DIAGNOSIS — Z6837 Body mass index (BMI) 37.0-37.9, adult: Secondary | ICD-10-CM

## 2019-06-04 DIAGNOSIS — E669 Obesity, unspecified: Secondary | ICD-10-CM

## 2019-06-10 ENCOUNTER — Telehealth: Payer: Self-pay | Admitting: Licensed Clinical Social Worker

## 2019-06-10 ENCOUNTER — Ambulatory Visit: Payer: Medicaid Other | Admitting: Licensed Clinical Social Worker

## 2019-06-10 NOTE — Telephone Encounter (Signed)
Call placed to patient regarding scheduled IBH appointment. LCSW left message requesting a return call.  

## 2019-07-07 MED ORDER — ESCITALOPRAM OXALATE 20 MG PO TABS: 40.0000 mg | ORAL_TABLET | Freq: Every day | ORAL | 1 refills | Status: DC

## 2019-07-11 ENCOUNTER — Other Ambulatory Visit: Payer: Self-pay

## 2019-08-04 ENCOUNTER — Ambulatory Visit (INDEPENDENT_AMBULATORY_CARE_PROVIDER_SITE_OTHER)
Admission: RE | Admit: 2019-08-04 | Discharge: 2019-08-04 | Disposition: A | Discharge status: Home / Self Care | Payer: Medicaid Other | Source: Ambulatory Visit

## 2019-08-04 DIAGNOSIS — J069 Acute upper respiratory infection, unspecified: Secondary | ICD-10-CM | POA: Diagnosis not present

## 2019-08-04 MED ORDER — CETIRIZINE HCL 10 MG PO TABS: 10.0000 mg | ORAL_TABLET | Freq: Every day | ORAL | 0 refills | Status: DC

## 2019-08-04 MED ORDER — BENZONATATE 100 MG PO CAPS: 100.0000 mg | ORAL_CAPSULE | Freq: Three times a day (TID) | ORAL | 0 refills | Status: DC

## 2019-08-04 MED ORDER — FLUTICASONE PROPIONATE 50 MCG/ACT NA SUSP: 1.0000 | Freq: Every day | NASAL | 0 refills | Status: DC

## 2019-08-04 NOTE — Discharge Instructions (Addendum)
Follow-up with PCP Return for face-to-face visit if symptom does not resolve 

## 2019-08-04 NOTE — ED Provider Notes (Signed)
Virtual Visit via Video Note:  Kelly Morales  initiated request for Telemedicine visit with [facility] Urgent Care team. I connected with Kelly Morales  on 08/04/2019 at 5 29 PM  for a synchronized telemedicine visit using a video enabled HIPPA compliant telemedicine application. I verified that I am speaking with Kelly Morales  using two identifiers. Durward Parcel, FNP  was physically located in a [facility] Urgent care site and Aleece Loyd was located at a different location.   The limitations of evaluation and management by telemedicine as well as the availability of in-person appointments were discussed. Patient was informed that she  may incur a bill ( including co-pay) for this virtual visit encounter. Kelly Morales  expressed understanding and gave verbal consent to proceed with virtual visit.     History of Present Illness:Kelly Morales  is a 29 y.o. female presents with a complaint of .  Denies sick exposure to COVID, flu or strep.  Denies recent travel.  Denies aggravating or alleviating symptoms.  Denies previous COVID infection.   Denies fever, chills, fatigue, nasal congestion, rhinorrhea, sore throat, cough, SOB, wheezing, chest pain, nausea, vomiting, changes in bowel or bladder habits.    Past Medical History:  Diagnosis Date  . Anemia   . Gestational diabetes   . Gestational diabetes mellitus (GDM) controlled on oral hypoglycemic drug, antepartum 01/15/2018   Diagnosis: 2-hr glucola at 28-weeks (abrnomal fasting and 1-hr) Current Diabetic Medications:  None  [ ]  Aspirin 81 mg daily after 12 weeks (? A2/B GDM)  Required Referrals for A1GDM or A2GDM: [ ]  Diabetes Education and - Rx sent  [ ]  Nutrition Consult - message sent   For A2/B GDM or higher classes of DM [ ]  Diabetes Education and Testing Supplies [ ]  Nutrition Counsult [ ]  Fetal    No Known Allergies      Observations/Objective: VITALS:  Per patient if applicable, see vitals. GENERAL: Alert, appears well and in no acute distress. HEENT: Atraumatic, conjunctiva clear, no obvious abnormalities on inspection of external nose and ears. NECK: Normal movements of the head and neck. CARDIOPULMONARY: No increased WOB. Speaking in clear sentences. I:E ratio WNL.  MS: Moves all visible extremities without noticeable abnormality. PSYCH: Pleasant and cooperative, well-groomed. Speech normal rate and rhythm. Affect is appropriate. Insight and judgement are appropriate. Attention is focused, linear, and appropriate.  NEURO: CN grossly intact. Oriented as arrived to appointment on time with no prompting. Moves both UE equally.  SKIN: No obvious lesions, wounds, erythema, or cyanosis noted on face or hands.    Assessment and Plan:   ICD-10-CM   1. URI with cough and congestion  J06.9     Follow Up Instructions: Follow-up with PCP Return for face-to-face visit if symptom does not resolve   I discussed the assessment and treatment plan with the patient. The patient was provided an opportunity to ask questions and all were answered. The patient agreed with the plan and demonstrated an understanding of the instructions.   The patient was advised to call back or seek an in-person evaluation if the symptoms worsen or if the condition fails to improve as anticipated.  I provided 15 mn minutes of non-face-to-face time during this encounter.    , FNP  08/04/2019 6:10 PM         , FNP 08/04/19 1810

## 2019-11-13 ENCOUNTER — Telehealth: Payer: Medicaid Other | Admitting: Internal Medicine

## 2019-11-25 ENCOUNTER — Other Ambulatory Visit: Payer: Self-pay

## 2019-11-25 ENCOUNTER — Ambulatory Visit (INDEPENDENT_AMBULATORY_CARE_PROVIDER_SITE_OTHER): Payer: Medicaid Other | Admitting: Licensed Clinical Social Worker

## 2019-11-25 DIAGNOSIS — F418 Other specified anxiety disorders: Secondary | ICD-10-CM

## 2019-11-25 DIAGNOSIS — F4323 Adjustment disorder with mixed anxiety and depressed mood: Secondary | ICD-10-CM

## 2019-11-25 NOTE — BH Specialist Note (Signed)
Integrated Behavioral Health Visit via Telemedicine (Telephone)  11/25/2019 Kelly Morales [id number]  Number of Integrated Behavioral Health visits: 1 Session Start time: 2:35 PM  Session End time: 3:55PM Total time: 20 minutes  Referring Provider: Dr. Earlene Plater Type of Service: Individual Patient location: Home [facility] Provider location: Office All persons participating in visit: LCSW and patient   I connected with Kelly Morales by telephone and verified that I am speaking with the correct person using two identifiers.   Discussed confidentiality: Yes   Confirmed demographics & insurance:  Yes   I discussed that engaging in this virtual visit, they consent to the provision of behavioral healthcare and the services will be billed under their insurance.   Patient and/or legal guardian expressed understanding and consented to virtual visit: Yes   PRESENTING CONCERNS: Patient or family reports the following symptoms/concerns: Pt reports increase in depression and anxiety symptoms. Symptoms include lack of motivation, low energy even though she sleeps well, weight gain, acne, increase in irritablity Duration of problem: July 2021; Severity of problem: moderate  STRENGTHS (Protective Factors/Coping Skills): Social connections, Social and Emotional competence, Concrete supports in place (healthy food, safe environments, etc.) and Sense of purpose  ASSESSMENT: Patient currently experiencing difficulty managing anxiety triggered by multiple psychosocial stressors.  She is participating in medication management through PCP.  Patient will benefit from therapy assist in management of symptoms. Identified mother as a support system.    GOALS ADDRESSED: Patient will: 1.  Reduce symptoms of: anxiety and stress  Pt agreed to continue to comply with med management  2.  Increase knowledge and/or ability of: coping skills Pt agreed to mindful eating and exercise weekly     Progress of Goals: Ongoing  INTERVENTIONS: Interventions utilized:  Solution-Focused Strategies Standardized Assessments completed & reviewed: Not Needed   OUTCOME: Patient Response: Pt was engaged during session and is open to brief therapy   PLAN: 1. Follow up with behavioral health clinician on : 12/04/2019 2. Behavioral recommendations: Utilize strategies discussed and comply with med management 3. Referral(s): Integrated [employer] (In Clinic)  I discussed the assessment and treatment plan with the patient and/or parent/guardian. They were provided an opportunity to ask questions and all were answered. They agreed with the plan and demonstrated an understanding of the instructions.   They were advised to call back or seek an in-person evaluation as appropriate.  I discussed that the purpose of this visit is to provide behavioral health care while limiting exposure to the novel coronavirus.  Discussed there is a possibility of technology failure and discussed alternative modes of communication if that failure occurs.  Bridgett Larsson, LCSW 12/16/2019 11:24 AM

## 2019-12-01 ENCOUNTER — Telehealth: Payer: Self-pay

## 2019-12-01 NOTE — Telephone Encounter (Signed)
Spoke with patient on rescheduling apointment for another date.

## 2019-12-02 ENCOUNTER — Ambulatory Visit: Payer: Medicaid Other | Admitting: Internal Medicine

## 2019-12-04 ENCOUNTER — Ambulatory Visit: Payer: Medicaid Other | Attending: Internal Medicine | Admitting: Licensed Clinical Social Worker

## 2019-12-04 ENCOUNTER — Other Ambulatory Visit: Payer: Self-pay

## 2019-12-04 DIAGNOSIS — F4323 Adjustment disorder with mixed anxiety and depressed mood: Secondary | ICD-10-CM

## 2019-12-11 ENCOUNTER — Ambulatory Visit: Payer: Medicaid Other | Attending: Internal Medicine | Admitting: Licensed Clinical Social Worker

## 2019-12-11 ENCOUNTER — Other Ambulatory Visit: Payer: Self-pay

## 2019-12-16 ENCOUNTER — Telehealth (INDEPENDENT_AMBULATORY_CARE_PROVIDER_SITE_OTHER): Payer: Self-pay | Admitting: Licensed Clinical Social Worker

## 2019-12-16 NOTE — Telephone Encounter (Signed)
Call placed to patient regarding re-scheduling of IBH appointment. LCSW left message requesting a return call.

## 2019-12-22 ENCOUNTER — Ambulatory Visit: Payer: Medicaid Other | Admitting: Internal Medicine

## 2019-12-22 DIAGNOSIS — R1033 Periumbilical pain: Secondary | ICD-10-CM

## 2019-12-26 NOTE — BH Specialist Note (Signed)
Integrated Behavioral Health Visit via Telemedicine (Telephone)  12/04/2019 Kelly Morales [id number]  Number of Integrated Behavioral Health visits: 2 Session Start time: 4:00 PM  Session End time: 4:25 PM Total time: 25 minutes  Referring Provider: Dr. Earlene Plater Type of Service: Individual Patient location: Home [facility] Provider location: Office All persons participating in visit: LCSW and Patient   I connected with Kelly Morales by telephone and verified that I am speaking with the correct person using two identifiers.   Discussed confidentiality: Yes   Confirmed demographics & insurance:  Yes   I discussed that engaging in this virtual visit, they consent to the provision of behavioral healthcare and the services will be billed under their insurance.   Patient and/or legal guardian expressed understanding and consented to virtual visit: Yes   PRESENTING CONCERNS: Patient or family reports the following symptoms/concerns: Pt reports difficulty managing feelings of irritability and feelings of overwhelm. Endorses negative self-talk Duration of problem: Ongoing; Severity of problem: moderate  STRENGTHS (Protective Factors/Coping Skills): Social connections, Social and Emotional competence, Concrete supports in place (healthy food, safe environments, etc.), Sense of purpose and Parental Resilience  ASSESSMENT: Patient currently experiencing symptoms of depression and anxiety.  Pt will benefit from continued medication management and therapy.     GOALS ADDRESSED: Patient will: 1.  Reduce symptoms of: agitation, anxiety and depression Pt agreed to continue with medication management 2.  Increase knowledge and/or ability of: healthy habits Pt agreed to utilize strategies to combat negative self-talk behaviors, as discussed   Progress of Goals: Ongoing  INTERVENTIONS: Interventions utilized:  Brief CBT Standardized Assessments completed & reviewed: Not  Needed   OUTCOME: Patient Response: Pt was engaged during session and was successful in identifying negative habits that result in increase of symptoms.    PLAN: 1. Follow up with behavioral health clinician on : 12/11/2019 2. Behavioral recommendations: Utilize strategies discussed and continue with med management 3. Referral(s): Integrated [employer] (In Clinic)  I discussed the assessment and treatment plan with the patient and/or parent/guardian. They were provided an opportunity to ask questions and all were answered. They agreed with the plan and demonstrated an understanding of the instructions.   They were advised to call back or seek an in-person evaluation as appropriate.  I discussed that the purpose of this visit is to provide behavioral health care while limiting exposure to the novel coronavirus.  Discussed there is a possibility of technology failure and discussed alternative modes of communication if that failure occurs.  Bridgett Larsson, LCSW 12/26/2019 2:51 PM

## 2020-01-16 ENCOUNTER — Ambulatory Visit: Payer: Medicaid Other | Admitting: Internal Medicine

## 2020-01-16 ENCOUNTER — Encounter: Payer: Self-pay | Admitting: Internal Medicine

## 2020-01-16 ENCOUNTER — Other Ambulatory Visit: Payer: Self-pay

## 2020-01-16 VITALS — BP 107/75 | HR 83 | Temp 97.5°F | Resp 17 | Wt 263.0 lb

## 2020-01-16 DIAGNOSIS — Z6838 Body mass index (BMI) 38.0-38.9, adult: Secondary | ICD-10-CM

## 2020-01-16 DIAGNOSIS — K429 Umbilical hernia without obstruction or gangrene: Secondary | ICD-10-CM | POA: Diagnosis not present

## 2020-01-16 DIAGNOSIS — E669 Obesity, unspecified: Secondary | ICD-10-CM | POA: Diagnosis not present

## 2020-01-16 NOTE — Progress Notes (Signed)
  Subjective:    Kelly Morales - 29 y.o. female MRN [id number]  Date of birth: November 11, 1990  HPI  Kelly Morales is here for concern about potential hernia. Reports located to right side of her belly button. Has been present since delivery of last child in 2020, delivered vaginally. No past surgical history. Pain with lifting and certain movements. No protrusion that is not reducible. No severe pain. No nausea or vomiting. No changes in bowels.      Health Maintenance:  Health Maintenance Due  Topic Date Due  . Hepatitis C Screening  Never done  . COVID-19 Vaccine (1) Never done  . INFLUENZA VACCINE  10/05/2019    -  reports that she has been smoking. She has never used smokeless tobacco. - Review of Systems: Per HPI. - Past Medical History: Patient Active Problem List   Diagnosis Date Noted  . Tobacco use disorder 04/03/2019  . Obesity 07/15/2018  . Gestational diabetes 03/30/2018  . Poor dentition 09/18/2017  . BMI 33.0-33.9,adult 05/25/2017  . Mixed anxiety and depressive disorder 07/24/2014  . Iron deficiency anemia 02/13/2014   - Medications: reviewed and updated   Objective:   Physical Exam BP 107/75   Pulse 83   Temp (!) 97.5 F (36.4 C) (Temporal)   Resp 17   Wt 263 lb (119.3 kg)   SpO2 97%   BMI 38.84 kg/m  Physical Exam Constitutional:      General: She is not in acute distress.    Appearance: She is not diaphoretic.  Cardiovascular:     Rate and Rhythm: Normal rate.  Pulmonary:     Effort: Pulmonary effort is normal. No respiratory distress.  Abdominal:     Comments: Small defect in fascial sheath near umbilicus. Questionable protrusion appreciated likely consistent with small hernia.   Musculoskeletal:        General: Normal range of motion.  Skin:    General: Skin is warm and dry.  Neurological:     Mental Status: She is alert and oriented to person, place, and time.  Psychiatric:        Mood and Affect: Affect normal.         Judgment: Judgment normal.          Assessment & Plan:    1. Umbilical hernia without obstruction and without gangrene Symptoms and exam consistent with small umbilical hernia. No signs/symptoms of obstruction or strangulation. Discussed option of surgical referral. Given not particularly symptomatic, patient elected to defer.   2. Class 2 obesity without serious comorbidity with body mass index (BMI) of 38.0 to 38.9 in adult, unspecified obesity type Discussed healthy diet changes and aiming for 150 minutes of cardiac exercise per week. Patient requests referral to [facility] clinic.  - Amb Ref to Medical Weight Management    Marcy Siren, D.O. 01/16/2020, 11:28 AM Primary Care at [facility]

## 2020-02-02 IMAGING — US US FETAL BPP W/ NON-STRESS
1 series · 13 of 14 positions shown · non-contrast
Comparison: none

[Series 1: us fetal bpp w/nonstress · 14 acquisitions, 13 frames shown]
[im 1/14]
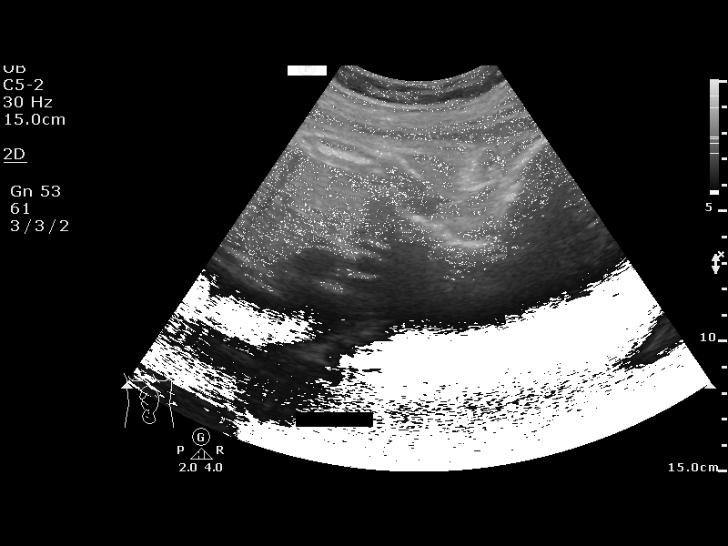
[im 2/14]
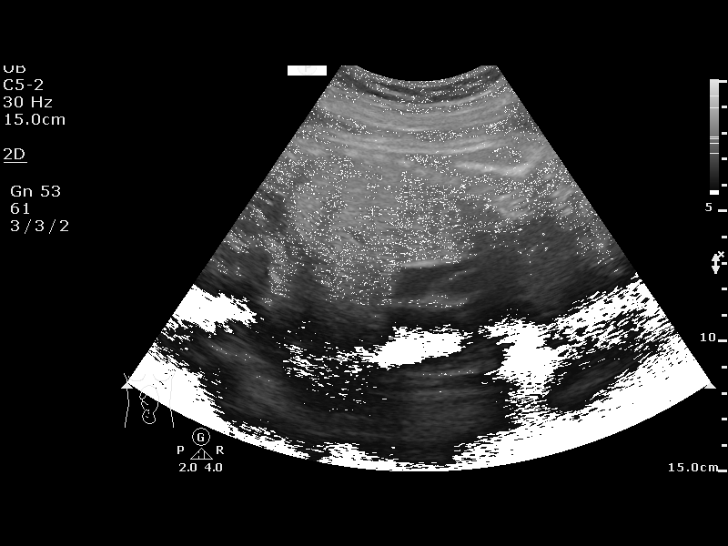
[im 3/14]
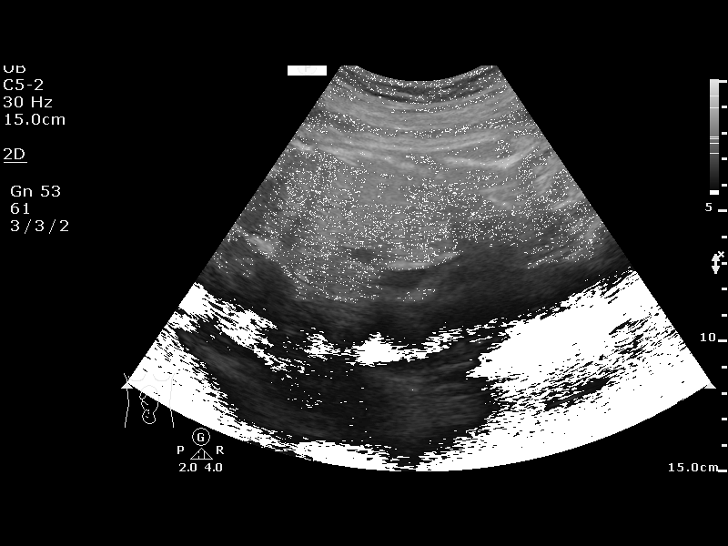
[im 4/14]
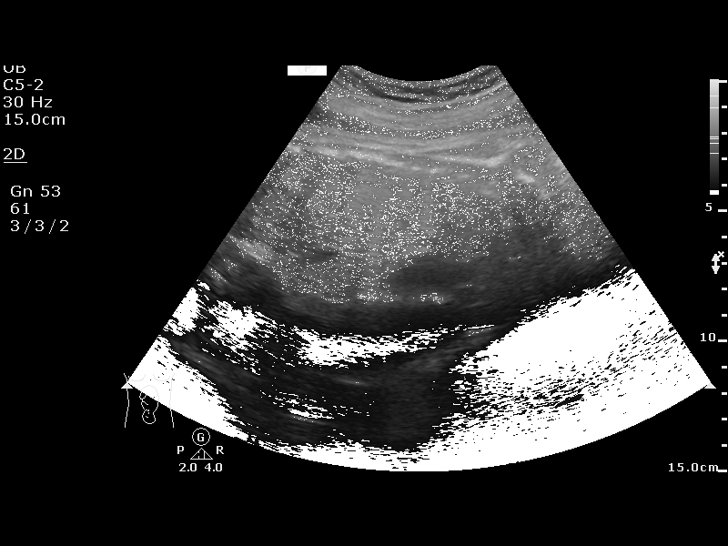
[im 5/14]
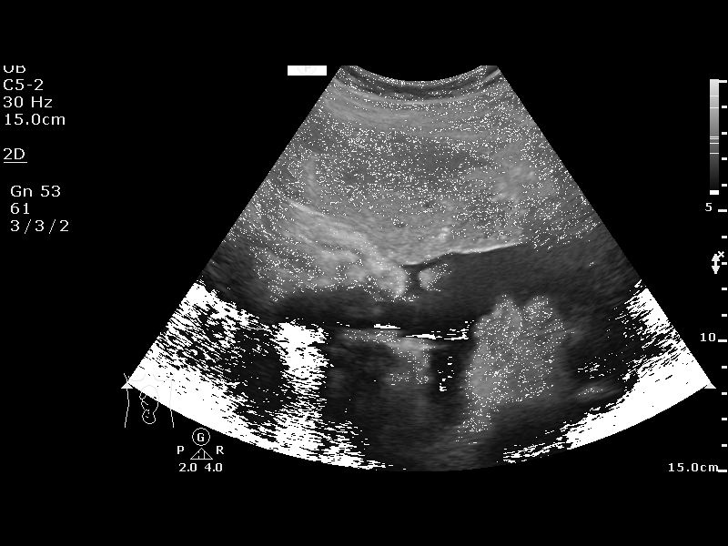
[im 6/14]
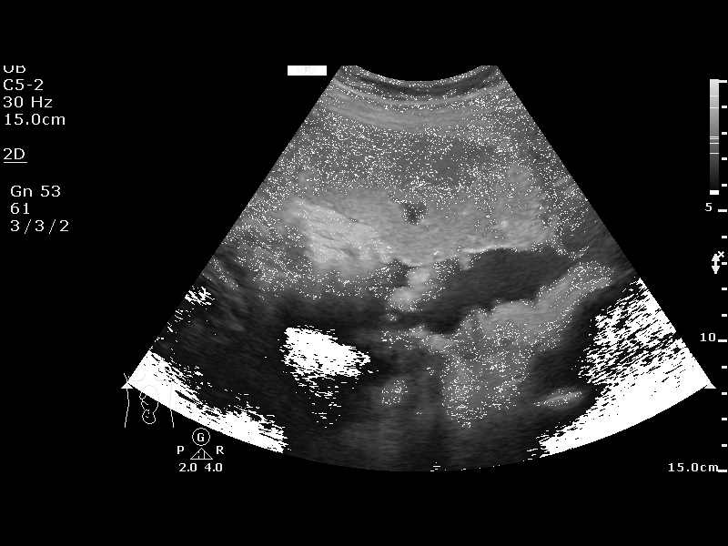
[im 8/14]
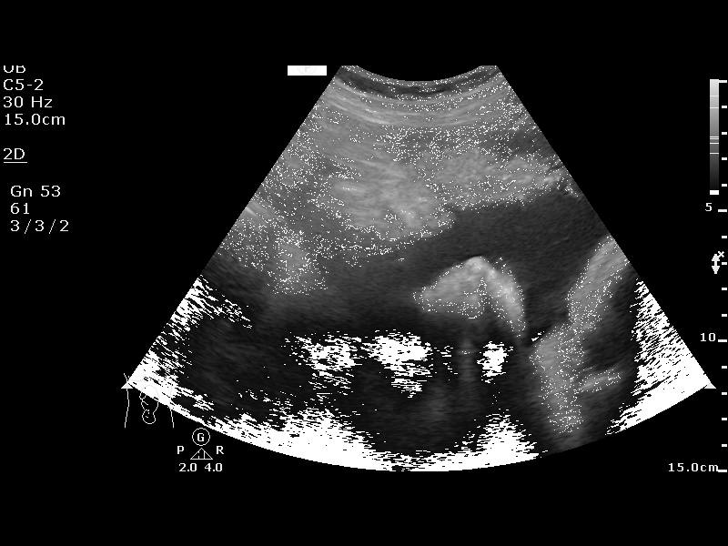
[im 9/14]
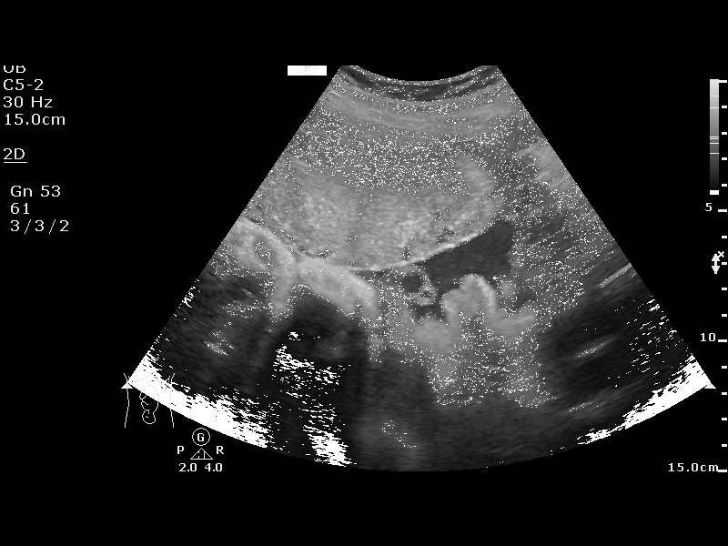
[im 10/14]
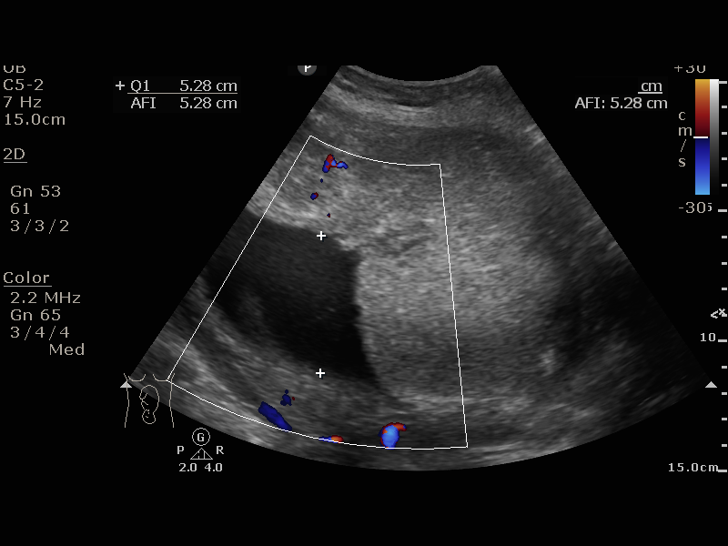
[im 11/14]
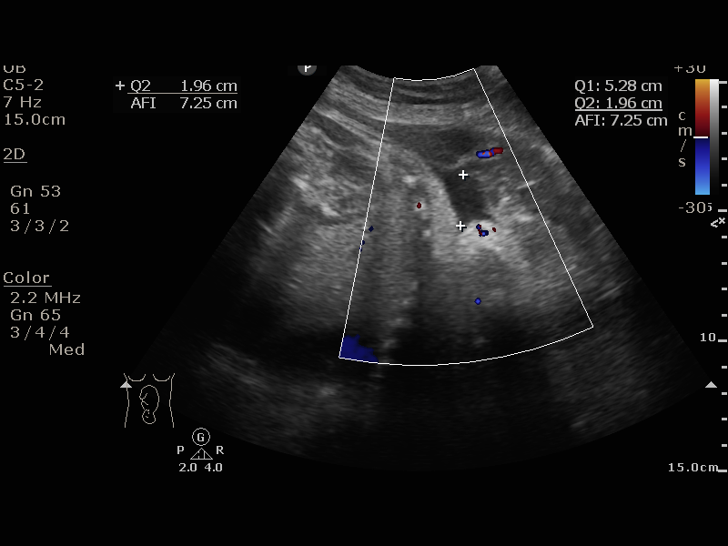
[im 12/14]
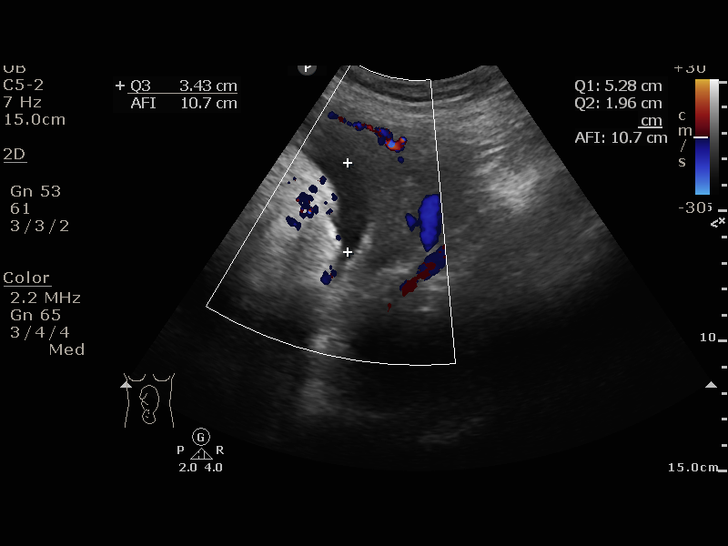
[im 13/14]
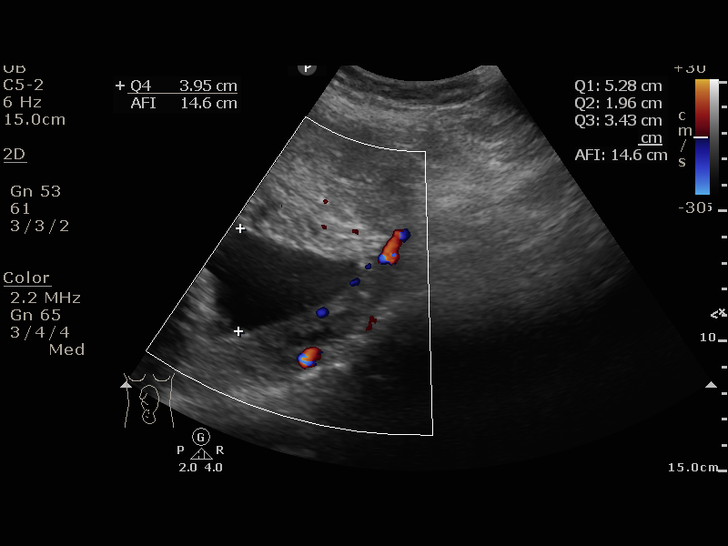
[im 14/14]
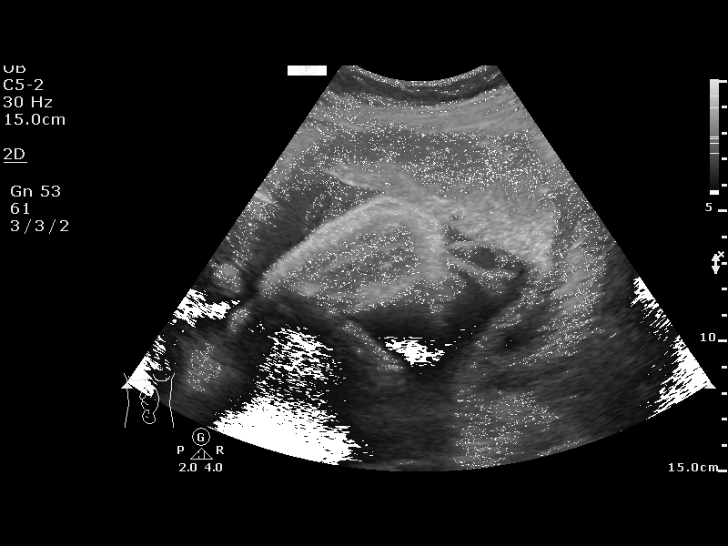

[13 of 14 positions shown; findings below may reference images not displayed]

ANDRZEJ JAN

                                                            OB/[facility]yn Clinic
                                                            Women's
                                                            [REDACTED]

  1  US FETAL BPP W/NONSTRESS             76818.4      APAVEN T-YEV
 ----------------------------------------------------------------------

 ----------------------------------------------------------------------
Service(s) Provided

 ----------------------------------------------------------------------
Indications

  36 weeks gestation of pregnancy
  Gestational diabetes in pregnancy,
  controlled by oral hypoglycemic drugs
 ----------------------------------------------------------------------
Vital Signs

                                                Height:        5'9"
Fetal Evaluation

 Num Of Fetuses:         1
 Preg. Location:         Intrauterine
 Cardiac Activity:       Observed
 Presentation:           Cephalic

 Amniotic Fluid
 AFI FV:      Within normal limits

 AFI Sum(cm)     %Tile       Largest Pocket(cm)
 14.62           54
 RUQ(cm)       RLQ(cm)       LUQ(cm)        LLQ(cm)

Biophysical Evaluation

 Amniotic F.V:   Pocket => 2 cm two         F. Tone:        Observed
                 planes
 F. Movement:    Observed                   N.S.T:          Reactive
 F. Breathing:   Observed                   Score:          [DATE]
OB History

 Gravidity:    5         Term:   2        Prem:   0        SAB:   1
 TOP:          1       Ectopic:  0        Living: 2
Gestational Age

 LMP:           36w 2d        Date:  06/30/17                 EDD:   04/06/18
 Best:          36w 2d     Det. By:  LMP  (06/30/17)          EDD:   04/06/18
Impression

 Antenatal testing is reassuring with BPP [DATE]. Normal
 amniotic fluid volume.
Recommendations

 Continue weekly antenatal testing till delivery.
              Gracia Blue, Sandy Karin

## 2020-03-17 ENCOUNTER — Encounter (INDEPENDENT_AMBULATORY_CARE_PROVIDER_SITE_OTHER): Payer: Self-pay

## 2020-03-25 ENCOUNTER — Ambulatory Visit (INDEPENDENT_AMBULATORY_CARE_PROVIDER_SITE_OTHER): Payer: Self-pay | Admitting: Family Medicine

## 2020-04-07 ENCOUNTER — Ambulatory Visit (INDEPENDENT_AMBULATORY_CARE_PROVIDER_SITE_OTHER): Payer: Self-pay | Admitting: Family Medicine

## 2020-04-26 ENCOUNTER — Encounter: Payer: Self-pay | Admitting: Nurse Practitioner

## 2020-04-26 ENCOUNTER — Ambulatory Visit (INDEPENDENT_AMBULATORY_CARE_PROVIDER_SITE_OTHER): Payer: Medicaid Other | Admitting: Nurse Practitioner

## 2020-04-26 ENCOUNTER — Other Ambulatory Visit: Payer: Self-pay

## 2020-04-26 VITALS — BP 122/77 | HR 77 | Ht 69.0 in | Wt 273.6 lb

## 2020-04-26 DIAGNOSIS — Z30431 Encounter for routine checking of intrauterine contraceptive device: Secondary | ICD-10-CM

## 2020-04-26 NOTE — Progress Notes (Signed)
    GYNECOLOGY OFFICE ENCOUNTER NOTE  History:  30 y.o. [id number] here today for today for IUD string check; Liletta  IUD was placed  07-05-2018.  Current menses are monthly and last 3-10 days. No complaints about the IUD, no concerning side effects.  The following portions of the patient's history were reviewed and updated as appropriate: allergies, current medications, past family history, past medical history, past social history, past surgical history and problem list. Last pap smear on 09-18-17 was normal, negative HRHPV.  Review of Systems:  Pertinent items are noted in HPI.   Objective:  Physical Exam Blood pressure 122/77, pulse 77, height 5\' 9"  (1.753 m), weight 273 lb 9.6 oz (124.1 kg). CONSTITUTIONAL: Well-developed, well-nourished female in no acute distress.  HENT:  Normocephalic, atraumatic. External right and left ear normal. Oropharynx is clear and moist EYES: Conjunctivae and EOM are normal. Pupils are equal, round, and reactive to light. No scleral icterus.  NECK: Normal range of motion, supple, no masses CARDIOVASCULAR: Normal heart rate noted RESPIRATORY: Effort and breath sounds normal, no problems with respiration noted ABDOMEN: Soft, no distention noted.   PELVIC: Normal appearing external genitalia; normal appearing vaginal mucosa and cervix.  IUD strings visualized, about  3-4 cm in length outside cervix.   Assessment & Plan:  Patient to continue IUD. Return for annual exam in August 2022  September 2022, RN, MSN, NP-BC Nurse Practitioner, [facility] for [facility] 04/26/2020 7:43 PM

## 2020-04-30 ENCOUNTER — Telehealth: Payer: Self-pay | Admitting: Internal Medicine

## 2020-04-30 NOTE — Telephone Encounter (Signed)
If referral is placed patient would like it sent to [facility]  [facility] Center - [facility]    [address] [place], [place], [place] [address]

## 2020-05-11 ENCOUNTER — Telehealth: Payer: Self-pay

## 2020-05-11 ENCOUNTER — Other Ambulatory Visit: Payer: Self-pay | Admitting: Internal Medicine

## 2020-05-11 DIAGNOSIS — E669 Obesity, unspecified: Secondary | ICD-10-CM

## 2020-05-11 NOTE — Telephone Encounter (Signed)
I placed the referral for patient's preferred location.   Marcy Siren, D.O. Primary Care at [facility]  05/11/2020, 11:28 AM

## 2020-05-11 NOTE — Telephone Encounter (Signed)
MyChart response sent to pt informing of referral

## 2020-05-12 NOTE — Telephone Encounter (Signed)
Erroneous encounter

## 2020-05-17 ENCOUNTER — Encounter: Payer: Medicaid Other | Admitting: Internal Medicine

## 2020-05-25 ENCOUNTER — Other Ambulatory Visit: Payer: Self-pay | Admitting: Internal Medicine

## 2020-05-31 NOTE — Telephone Encounter (Signed)
Please advise on refill or OV needed.

## 2020-06-01 ENCOUNTER — Telehealth (INDEPENDENT_AMBULATORY_CARE_PROVIDER_SITE_OTHER): Payer: Medicaid Other | Admitting: Nurse Practitioner

## 2020-06-01 DIAGNOSIS — F418 Other specified anxiety disorders: Secondary | ICD-10-CM | POA: Diagnosis not present

## 2020-06-01 MED ORDER — ESCITALOPRAM OXALATE 20 MG PO TABS: 40.0000 mg | ORAL_TABLET | Freq: Every day | ORAL | 1 refills | Status: DC

## 2020-06-01 NOTE — Patient Instructions (Signed)
Anxiety and depression :  May continue lexapro  Stay active  Stay well hydrated   Follow up:  Follow up with Dr. Earlene Plater asap for physical and weight management

## 2020-06-01 NOTE — Progress Notes (Signed)
Off of medication- Lexapro x 4-5 days.

## 2020-06-01 NOTE — Progress Notes (Signed)
Virtual Visit via Telephone Note  I connected with Kelly Morales on 06/01/20 at  3:50 PM EDT by telephone and verified that I am speaking with the correct person using two identifiers.  Location: Patient: home Provider: office   I discussed the limitations, risks, security and privacy concerns of performing an evaluation and management service by telephone and the availability of in person appointments. I also discussed with the patient that there may be a patient responsible charge related to this service. The patient expressed understanding and agreed to proceed.   History of Present Illness:  Patient presents today for follow-up on anxiety to a televisit.  Patient states that she has been doing well and needs a refill on her Lexapro.  She states that she has been on medication for about 3 to 4 days.  Patient states that she did try to get through healthy weight and wellness as advised at last visit for Dr. Earlene Plater.  She states that the would not consider weight loss surgery on her at this time.  She would like a second opinion with a different weight loss center.  Patient also needs a yearly physical exam.  We discussed that we will set up an appointment for her physical as soon as possible with Dr. Earlene Plater and they can discussed weight loss in further detail at that appointment.  [facility] also [facility]. Denies f/c/s, n/v/d, hemoptysis, PND, chest pain or edema.     Observations/Objective: Vitals with BMI 04/26/2020 01/16/2020 04/17/2019  Height 5\' 9"  - -  Weight 273 lbs 10 oz 263 lbs 254 lbs  BMI 40.39 - -  Systolic 122 107  Diastolic 77 75 74  Pulse 77 83 80     Assessment and Plan:  Anxiety and depression :  May continue lexapro  Stay active  Stay well hydrated   Follow up:  Follow up with Dr. 144 asap for physical and weight management      I discussed the assessment and treatment plan with the patient. The  patient was provided an opportunity to ask questions and all were answered. The patient agreed with the plan and demonstrated an understanding of the instructions.   The patient was advised to call back or seek an in-person evaluation if the symptoms worsen or if the condition fails to improve as anticipated.  I provided 23 minutes of non-face-to-face time during this encounter.   Earlene Plater, NP

## 2020-06-30 ENCOUNTER — Encounter: Payer: Medicaid Other | Admitting: Internal Medicine

## 2020-08-25 ENCOUNTER — Telehealth: Payer: Medicaid Other | Admitting: Physician Assistant

## 2020-08-25 DIAGNOSIS — B373 Candidiasis of vulva and vagina: Secondary | ICD-10-CM | POA: Diagnosis not present

## 2020-08-25 DIAGNOSIS — B3731 Acute candidiasis of vulva and vagina: Secondary | ICD-10-CM

## 2020-08-26 MED ORDER — FLUCONAZOLE 150 MG PO TABS: 150.0000 mg | ORAL_TABLET | Freq: Once | ORAL | 0 refills | Status: AC

## 2020-08-26 NOTE — Progress Notes (Signed)
We are sorry that you are not feeling well. Here is how we plan to help! Based on what you shared with me it looks like you: May have a yeast vaginosis  Vaginosis is an inflammation of the vagina that can result in discharge, itching and pain. The cause is usually a change in the normal balance of vaginal bacteria or an infection. Vaginosis can also result from reduced estrogen levels after menopause.  The most common causes of vaginosis are:   Bacterial vaginosis which results from an overgrowth of one on several organisms that are normally present in your vagina.   Yeast infections which are caused by a naturally occurring fungus called candida.   Vaginal atrophy (atrophic vaginosis) which results from the thinning of the vagina from reduced estrogen levels after menopause.   Trichomoniasis which is caused by a parasite and is commonly transmitted by sexual intercourse.  Factors that increase your risk of developing vaginosis include: . Medications, such as antibiotics and steroids . Uncontrolled diabetes . Use of hygiene products such as bubble bath, vaginal spray or vaginal deodorant . Douching . Wearing damp or tight-fitting clothing . Using an intrauterine device (IUD) for birth control . Hormonal changes, such as those associated with pregnancy, birth control pills or menopause . Sexual activity . Having a sexually transmitted infection  Your treatment plan is A single Diflucan (fluconazole) 150mg tablet once.  I have electronically sent this prescription into the pharmacy that you have chosen.  Be sure to take all of the medication as directed. Stop taking any medication if you develop a rash, tongue swelling or shortness of breath. Mothers who are breast feeding should consider pumping and discarding their breast milk while on these antibiotics. However, there is no consensus that infant exposure at these doses would be harmful.  Remember that medication creams can weaken latex  condoms. .   HOME CARE:  Good hygiene may prevent some types of vaginosis from recurring and may relieve some symptoms:  . Avoid baths, hot tubs and whirlpool spas. Rinse soap from your outer genital area after a shower, and dry the area well to prevent irritation. Don't use scented or harsh soaps, such as those with deodorant or antibacterial action. . Avoid irritants. These include scented tampons and pads. . Wipe from front to back after using the toilet. Doing so avoids spreading fecal bacteria to your vagina.  Other things that may help prevent vaginosis include:  . Don't douche. Your vagina doesn't require cleansing other than normal bathing. Repetitive douching disrupts the normal organisms that reside in the vagina and can actually increase your risk of vaginal infection. Douching won't clear up a vaginal infection. . Use a latex condom. Both female and female latex condoms may help you avoid infections spread by sexual contact. . Wear cotton underwear. Also wear pantyhose with a cotton crotch. If you feel comfortable without it, skip wearing underwear to bed. Yeast thrives in moist environments Your symptoms should improve in the next day or two.  GET HELP RIGHT AWAY IF:  . You have pain in your lower abdomen ( pelvic area or over your ovaries) . You develop nausea or vomiting . You develop a fever . Your discharge changes or worsens . You have persistent pain with intercourse . You develop shortness of breath, a rapid pulse, or you faint.  These symptoms could be signs of problems or infections that need to be evaluated by a medical provider now.  MAKE SURE YOU      Understand these instructions.  Will watch your condition.  Will get help right away if you are not doing well or get worse.  Your e-visit answers were reviewed by a board certified advanced clinical practitioner to complete your personal care plan. Depending upon the condition, your plan could have included  both over the counter or prescription medications. Please review your pharmacy choice to make sure that you have choses a pharmacy that is open for you to pick up any needed prescription, Your safety is important to us. If you have drug allergies check your prescription carefully.   You can use MyChart to ask questions about today's visit, request a non-urgent call back, or ask for a work or school excuse for 24 hours related to this e-Visit. If it has been greater than 24 hours you will need to follow up with your provider, or enter a new e-Visit to address those concerns. You will get a MyChart message within the next two days asking about your experience. I hope that your e-visit has been valuable and will speed your recovery.  I provided 5 minutes of non face-to-face time during this encounter for chart review and documentation.   

## 2020-09-25 NOTE — Progress Notes (Signed)
Patient did not show for appointment.   

## 2020-09-27 ENCOUNTER — Encounter: Payer: Medicaid Other | Admitting: Family

## 2020-11-02 ENCOUNTER — Ambulatory Visit: Payer: Medicaid Other | Admitting: Nurse Practitioner

## 2020-11-15 ENCOUNTER — Ambulatory Visit: Payer: Medicaid Other | Admitting: Family

## 2020-12-15 ENCOUNTER — Other Ambulatory Visit (HOSPITAL_COMMUNITY)
Admission: RE | Admit: 2020-12-15 | Discharge: 2020-12-15 | Disposition: A | Discharge status: Home / Self Care | Payer: Medicaid Other | Source: Ambulatory Visit | Attending: Nurse Practitioner | Admitting: Nurse Practitioner

## 2020-12-15 ENCOUNTER — Encounter: Payer: Self-pay | Admitting: Nurse Practitioner

## 2020-12-15 ENCOUNTER — Other Ambulatory Visit: Payer: Self-pay

## 2020-12-15 ENCOUNTER — Ambulatory Visit: Payer: Medicaid Other | Admitting: Nurse Practitioner

## 2020-12-15 VITALS — BP 132/83 | HR 84 | Ht 69.0 in | Wt 264.3 lb

## 2020-12-15 DIAGNOSIS — R030 Elevated blood-pressure reading, without diagnosis of hypertension: Secondary | ICD-10-CM | POA: Diagnosis not present

## 2020-12-15 DIAGNOSIS — Z124 Encounter for screening for malignant neoplasm of cervix: Secondary | ICD-10-CM | POA: Insufficient documentation

## 2020-12-15 DIAGNOSIS — Z6839 Body mass index (BMI) 39.0-39.9, adult: Secondary | ICD-10-CM

## 2020-12-15 DIAGNOSIS — Z0001 Encounter for general adult medical examination with abnormal findings: Secondary | ICD-10-CM

## 2020-12-15 DIAGNOSIS — Z87891 Personal history of nicotine dependence: Secondary | ICD-10-CM

## 2020-12-15 DIAGNOSIS — Z01419 Encounter for gynecological examination (general) (routine) without abnormal findings: Secondary | ICD-10-CM

## 2020-12-15 DIAGNOSIS — Z975 Presence of (intrauterine) contraceptive device: Secondary | ICD-10-CM | POA: Diagnosis not present

## 2020-12-15 NOTE — Progress Notes (Signed)
GYNECOLOGY ANNUAL PREVENTATIVE CARE ENCOUNTER NOTE  Subjective:   Kelly Morales is a 30 y.o. [phone] female here for a routine annual gynecologic exam.  Current complaints: wants IUD strings checked.   Denies abnormal vaginal bleeding, discharge, pelvic pain, problems with intercourse or other gynecologic concerns.    Gynecologic History No LMP recorded. (Menstrual status: IUD). Contraception: IUD liletta inserted 07-15-18 Last Pap: 09-18-17. Results were: normal  Obstetric History OB History  Gravida Para Term Preterm AB Living  5 3 3   2 3   SAB IAB Ectopic Multiple Live Births  1 1     3     # Outcome Date GA Lbr Len/2nd Weight Sex Delivery Anes PTL Lv  5 Term 03/31/18 [redacted]w[redacted]d / 00:25 9 lb 10.9 oz (4.39 kg) M Vag-Spont EPI  LIV     Birth Comments: WNL  4 Term 09/13/14 [redacted]w[redacted]d  8 lb (3.629 kg) M Vag-Spont EPI N LIV  3 Term 08/30/11 [redacted]w[redacted]d  7 lb 14 oz (3.572 kg) M Vag-Spont EPI  LIV  2 IAB           1 SAB             Past Medical History:  Diagnosis Date   Anemia    Gestational diabetes    Gestational diabetes mellitus (GDM) controlled on oral hypoglycemic drug, antepartum 01/15/2018   Diagnosis: 2-hr glucola at 28-weeks (abrnomal fasting and 1-hr) Current Diabetic Medications:  None  [ ]  Aspirin 81 mg daily after 12 weeks (? A2/B GDM)  Required Referrals for A1GDM or A2GDM: [ ]  Diabetes Education and [redacted]w[redacted]d - Rx sent  [ ]  Nutrition Consult - message sent   For A2/B GDM or higher classes of DM [ ]  Diabetes Education and Testing Supplies [ ]  Nutrition Counsult [ ]  Fetal    History reviewed. No pertinent surgical history.  Current Outpatient Medications on File Prior to Visit  Medication Sig Dispense Refill   escitalopram (LEXAPRO) 20 MG tablet Take 2 tablets (40 mg total) by mouth daily. 180 tablet 1   levonorgestrel (LILETTA, 52 MG,) 19.5 MCG/DAY IUD IUD 1 each by Intrauterine route once.     No current facility-administered medications on file prior to  visit.    No Known Allergies  Social History   Socioeconomic History   Marital status: Significant Other    Spouse name: Not on file   Number of children: Not on file   Years of education: Not on file   Highest education level: Not on file  Occupational History   Not on file  Tobacco Use   Smoking status: Some Days    Types: Cigarettes    Last attempt to quit: 2019    Years since quitting: 3.7   Smokeless tobacco: Never  Vaping Use   Vaping Use: Former   Quit date: 08/04/2017  Substance and Sexual Activity   Alcohol use: Not Currently   Drug use: Never   Sexual activity: Yes    Birth control/protection: None  Other Topics Concern   Not on file  Social History Narrative   Not on file   Social Determinants of Health   Financial Resource Strain: Not on file  Food Insecurity: Not on file  Transportation Needs: Not on file  Physical Activity: Not on file  Stress: Not on file  Social Connections: Not on file  Intimate Partner Violence: Not on file    Family History  Problem Relation Age of Onset   Diabetes  Mother    Hypertension Mother    Hypertension Maternal Grandmother     The following portions of the patient's history were reviewed and updated as appropriate: allergies, current medications, past family history, past medical history, past social history, past surgical history and problem list.  Review of Systems Pertinent items noted in HPI and remainder of comprehensive ROS otherwise negative.   Objective:  BP 132/83   Pulse 84   Ht 5\' 9"  (1.753 m)   Wt 264 lb 4.8 oz (119.9 kg)   BMI 39.03 kg/m  CONSTITUTIONAL: Well-developed, well-nourished female in no acute distress.  HENT:  Normocephalic, atraumatic, External right and left ear normal.  EYES: Conjunctivae and EOM are normal. Pupils are equal, round.  No scleral icterus.  NECK: Normal range of motion, supple, no masses.  Normal thyroid.  SKIN: Skin is warm and dry. No rash noted. Not diaphoretic. No  erythema. No pallor. NEUROLOGIC: Alert and oriented to person, place, and time. Normal reflexes, muscle tone coordination. No cranial nerve deficit noted. PSYCHIATRIC: Normal mood and affect. Normal behavior. Normal judgment and thought content. CARDIOVASCULAR: Normal heart rate noted, regular rhythm RESPIRATORY: Clear to auscultation bilaterally. Effort and breath sounds normal, no problems with respiration noted. BREASTS: Symmetric in size. No masses, skin changes, nipple drainage, or lymphadenopathy. ABDOMEN: Soft, no distention noted.  No tenderness, rebound or guarding.  PELVIC: Normal appearing external genitalia; normal appearing vaginal mucosa and cervix.  No abnormal discharge noted.  Pap smear obtained.  Normal uterine size, no other palpable masses, no uterine or adnexal tenderness.  IUD strings visualized.  No part of IUD palpated on bimanual exam. MUSCULOSKELETAL: Normal range of motion. No tenderness.  No cyanosis, clubbing, or edema.    Assessment and Plan:  1. Encounter for annual routine gynecological examination Doing well and happy with IUD  2. BMI 39.0-39.9,adult Continues to work on weight loss and increasing exercise - has lost almost 20 pounds  3. Presence of 52 mg levonorgestrel-releasing intrauterine device (IUD) Strings seen on exam  4. Former smoker Continues to be quit  5. Pap smear for cervical cancer screening  - Cytology - PAP( [facility])  6. Elevated BP without diagnosis of hypertension Very slightly elevated BP but has a strong family history of HTN.  Advised to see PCP for further evaluation.  Will follow up results of pap smear and manage accordingly. Routine preventative health maintenance measures emphasized. Please refer to After Visit Summary for other counseling recommendations.    11-18-1988, RN, MSN, NP-BC Nurse Practitioner, [facility] for [facility]

## 2020-12-16 LAB — CYTOLOGY - PAP
Chlamydia: NEGATIVE
Comment: NEGATIVE
Comment: NEGATIVE
Comment: NEGATIVE
Comment: NORMAL
Diagnosis: NEGATIVE
High risk HPV: NEGATIVE
Neisseria Gonorrhea: NEGATIVE
Trichomonas: NEGATIVE

## 2020-12-17 ENCOUNTER — Encounter: Payer: Medicaid Other | Admitting: Family Medicine

## 2021-01-10 ENCOUNTER — Encounter: Payer: Medicaid Other | Admitting: Family Medicine

## 2021-01-12 ENCOUNTER — Other Ambulatory Visit: Payer: Self-pay | Admitting: Nurse Practitioner

## 2021-02-02 DIAGNOSIS — F331 Major depressive disorder, recurrent, moderate: Secondary | ICD-10-CM | POA: Diagnosis not present

## 2021-02-07 DIAGNOSIS — F331 Major depressive disorder, recurrent, moderate: Secondary | ICD-10-CM | POA: Diagnosis not present

## 2021-02-22 DIAGNOSIS — F331 Major depressive disorder, recurrent, moderate: Secondary | ICD-10-CM | POA: Diagnosis not present

## 2021-03-08 DIAGNOSIS — F331 Major depressive disorder, recurrent, moderate: Secondary | ICD-10-CM | POA: Diagnosis not present

## 2021-03-15 DIAGNOSIS — F331 Major depressive disorder, recurrent, moderate: Secondary | ICD-10-CM | POA: Diagnosis not present

## 2021-03-21 ENCOUNTER — Other Ambulatory Visit: Payer: Self-pay | Admitting: Nurse Practitioner

## 2021-03-30 DIAGNOSIS — F331 Major depressive disorder, recurrent, moderate: Secondary | ICD-10-CM | POA: Diagnosis not present

## 2021-03-31 ENCOUNTER — Other Ambulatory Visit: Payer: Self-pay

## 2021-03-31 ENCOUNTER — Ambulatory Visit (INDEPENDENT_AMBULATORY_CARE_PROVIDER_SITE_OTHER): Payer: Medicaid Other | Admitting: Family Medicine

## 2021-03-31 ENCOUNTER — Encounter: Payer: Self-pay | Admitting: Family Medicine

## 2021-03-31 VITALS — BP 109/76 | HR 65 | Temp 98.1°F | Resp 16 | Ht 69.0 in | Wt 253.4 lb

## 2021-03-31 DIAGNOSIS — F418 Other specified anxiety disorders: Secondary | ICD-10-CM | POA: Diagnosis not present

## 2021-03-31 MED ORDER — BUPROPION HCL ER (XL) 150 MG PO TB24: 150.0000 mg | ORAL_TABLET | Freq: Every day | ORAL | 0 refills | Status: DC

## 2021-03-31 NOTE — Progress Notes (Signed)
Patient is here for CPE Patient c/o headaches that she thinks is her BP/ stress Patient would like an alternative depression med. Patient think it is not working after 6 years

## 2021-03-31 NOTE — Progress Notes (Signed)
Established Patient Office Visit  Subjective:  Patient ID: Kelly Morales, female    DOB: Jul 22, 1990  Age: 31 y.o. MRN: [id number]  CC:  Chief Complaint  Patient presents with   Annual Exam    HPI Kelly Morales presents for followup of depression and anxiety. She reports that she has been having increased social stressors and would like some additional meds. 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   Past Medical History:  Diagnosis Date   Anemia    Gestational diabetes    Gestational diabetes mellitus (GDM) controlled on oral hypoglycemic drug, antepartum 01/15/2018   Diagnosis: 2-hr glucola at 28-weeks (abrnomal fasting and 1-hr) Current Diabetic Medications:  None  [ ]  Aspirin 81 mg daily after 12 weeks (? A2/B GDM)  Required Referrals for A1GDM or A2GDM: [ ]  Diabetes Education and - Rx sent  [ ]  Nutrition Consult - message sent   For A2/B GDM or higher classes of DM [ ]  Diabetes Education and Testing Supplies [ ]  Nutrition Counsult [ ]  Fetal    No past surgical history on file.  Family History  Problem Relation Age of Onset   Diabetes Mother    Hypertension Mother    Hypertension Maternal Grandmother     Social History   Socioeconomic History   Marital status: Significant Other    Spouse name: Not on file   Number of children: Not on file   Years of education: Not on file   Highest education level: Not on file  Occupational History   Not on file  Tobacco Use   Smoking status: Some Days    Types: Cigarettes    Last attempt to quit: 2019    Years since quitting: 4.0   Smokeless tobacco: Never  Vaping Use   Vaping Use: Former   Quit date: 08/04/2017  Substance and Sexual Activity   Alcohol use: Not Currently   Drug use: Never   Sexual activity: Yes    Birth control/protection: None  Other Topics Concern   Not on file  Social History Narrative   Not on file   Social Determinants of Health   Financial Resource Strain: Not on file   Food Insecurity: Not on file  Transportation Needs: Not on file  Physical Activity: Not on file  Stress: Not on file  Social Connections: Not on file  Intimate Partner Violence: Not on file    ROS Review of Systems  Psychiatric/Behavioral:  Positive for sleep disturbance. Negative for self-injury and suicidal ideas. The patient is nervous/anxious.   All other systems reviewed and are negative.  Objective:   Today's Vitals: BP 109/76    Pulse 65    Temp 98.1 F (36.7 C) (Oral)    Resp 16    Ht 5\' 9"  (1.753 m)    Wt 253 lb 6.4 oz (114.9 kg)    SpO2 98%    BMI 37.42 kg/m   Physical Exam  Assessment & Plan:   1. Mixed anxiety and depressive disorder  Will add wellbutrin 150 mg to regimen and monitor. Continue with counselor.    Outpatient Encounter Medications as of 03/31/2021  Medication Sig   buPROPion (WELLBUTRIN XL) 150 MG 24 hr tablet Take 1 tablet (150 mg total) by mouth daily.   escitalopram (LEXAPRO) 20 MG tablet Take 2 tablets (40 mg total) by mouth daily.   levonorgestrel (LILETTA, 52 MG,) 19.5 MCG/DAY IUD IUD 1 each by Intrauterine route once.   No facility-administered encounter medications  on file as of 03/31/2021.    Follow-up: No follow-ups on file.   Tommie Raymond, MD

## 2021-04-01 ENCOUNTER — Encounter: Payer: Self-pay | Admitting: Family Medicine

## 2021-04-06 ENCOUNTER — Encounter: Payer: Self-pay | Admitting: Family Medicine

## 2021-04-08 ENCOUNTER — Other Ambulatory Visit: Payer: Self-pay | Admitting: Family Medicine

## 2021-04-08 ENCOUNTER — Other Ambulatory Visit: Payer: Self-pay

## 2021-04-08 DIAGNOSIS — F418 Other specified anxiety disorders: Secondary | ICD-10-CM

## 2021-04-08 MED ORDER — ESCITALOPRAM OXALATE 20 MG PO TABS: 40.0000 mg | ORAL_TABLET | Freq: Every day | ORAL | 0 refills | Status: DC

## 2021-04-08 NOTE — Progress Notes (Signed)
Provider original rx failed transmitting to pharmacy, medication resent

## 2021-04-08 NOTE — Telephone Encounter (Signed)
Medication resent, transmission to pharmacy failed originally

## 2021-04-13 DIAGNOSIS — F331 Major depressive disorder, recurrent, moderate: Secondary | ICD-10-CM | POA: Diagnosis not present

## 2021-04-25 ENCOUNTER — Other Ambulatory Visit: Payer: Self-pay | Admitting: *Deleted

## 2021-04-25 MED ORDER — BUPROPION HCL ER (XL) 150 MG PO TB24: 150.0000 mg | ORAL_TABLET | Freq: Every day | ORAL | 0 refills | Status: DC

## 2021-04-27 DIAGNOSIS — F331 Major depressive disorder, recurrent, moderate: Secondary | ICD-10-CM | POA: Diagnosis not present

## 2021-05-05 ENCOUNTER — Encounter: Payer: Medicaid Other | Admitting: Family Medicine

## 2021-05-11 DIAGNOSIS — F331 Major depressive disorder, recurrent, moderate: Secondary | ICD-10-CM | POA: Diagnosis not present

## 2021-05-26 ENCOUNTER — Telehealth: Payer: Medicaid Other | Admitting: Emergency Medicine

## 2021-05-26 DIAGNOSIS — J069 Acute upper respiratory infection, unspecified: Secondary | ICD-10-CM | POA: Diagnosis not present

## 2021-05-26 MED ORDER — BENZONATATE 100 MG PO CAPS: 100.0000 mg | ORAL_CAPSULE | Freq: Two times a day (BID) | ORAL | 0 refills | Status: AC | PRN

## 2021-05-26 MED ORDER — IPRATROPIUM BROMIDE 0.03 % NA SOLN: 2.0000 | Freq: Two times a day (BID) | NASAL | 0 refills | Status: AC

## 2021-05-26 NOTE — Progress Notes (Signed)
E-Visit for Upper Respiratory Infection  ? ?We are sorry you are not feeling well.  Here is how we plan to help! ? ?Based on what you have shared with me, it looks like you may have a viral upper respiratory infection.  Upper respiratory infections are caused by a large number of viruses; however, rhinovirus is the most common cause.  ? ?Symptoms vary from person to person, with common symptoms including sore throat, cough, fatigue or lack of energy and feeling of general discomfort.  A low-grade fever of up to 100.4 may present, but is often uncommon.  Symptoms vary however, and are closely related to a person's age or underlying illnesses.  The most common symptoms associated with an upper respiratory infection are nasal discharge or congestion, cough, sneezing, headache and pressure in the ears and face.  These symptoms usually persist for about 3 to 10 days, but can last up to 2 weeks.  It is important to know that upper respiratory infections do not cause serious illness or complications in most cases.   ? ?Upper respiratory infections can be transmitted from person to person, with the most common method of transmission being a person's hands.  The virus is able to live on the skin and can infect other persons for up to 2 hours after direct contact.  Also, these can be transmitted when someone coughs or sneezes; thus, it is important to cover the mouth to reduce this risk.  To keep the spread of the illness at [place], good hand hygiene is very important. ? ?This is an infection that is most likely caused by a virus. There are no specific treatments other than to help you with the symptoms until the infection runs its course.  We are sorry you are not feeling well.  Here is how we plan to help! ? ? ?For nasal congestion, you may use an oral decongestants such as Mucinex D or if you have glaucoma or high blood pressure use plain Mucinex.  Saline nasal spray or nasal drops can help and can safely be used as often as  needed for congestion.  For your congestion, I have prescribed Ipratropium Bromide nasal spray 0.03% two sprays in each nostril 2-3 times a day ? ?If you do not have a history of heart disease, hypertension, diabetes or thyroid disease, prostate/bladder issues or glaucoma, you may also use Sudafed to treat nasal congestion.  It is highly recommended that you consult with a pharmacist or your primary care physician to ensure this medication is safe for you to take.    ? ?If you have a cough, you may use cough suppressants such as Delsym and Robitussin.  If you have glaucoma or high blood pressure, you can also use Coricidin HBP.   ?For cough I have prescribed for you A prescription cough medication called Tessalon Perles 100 mg. You may take 1-2 capsules every 8 hours as needed for cough ? ?If you have a sore or scratchy throat, use a saltwater gargle- ? to ? teaspoon of salt dissolved in a 4-ounce to 8-ounce glass of warm water.  Gargle the solution for approximately 15-30 seconds and then spit.  It is important not to swallow the solution.  You can also use throat lozenges/cough drops and Chloraseptic spray to help with throat pain or discomfort.  Warm or cold liquids can also be helpful in relieving throat pain. ? ?For headache, pain or general discomfort, you can use Ibuprofen or Tylenol as directed.   ?  Some authorities believe that zinc sprays or the use of Echinacea may shorten the course of your symptoms.   HOME CARE Only take medications as instructed by your medical team. Be sure to drink plenty of fluids. Water is fine as well as fruit juices, sodas and electrolyte beverages. You may want to stay away from caffeine or alcohol. If you are nauseated, try taking small sips of liquids. How do you know if you are getting enough fluid? Your urine should be a pale yellow or almost colorless. Get rest. Taking a steamy shower or using a humidifier may help nasal congestion and ease sore throat pain. You can  place a towel over your head and breathe in the steam from hot water coming from a faucet. Using a saline nasal spray works much the same way. Cough drops, hard candies and sore throat lozenges may ease your cough. Avoid close contacts especially the very young and the elderly Cover your mouth if you cough or sneeze Always remember to wash your hands.   GET HELP RIGHT AWAY IF: You develop worsening fever. If your symptoms do not improve within 10 days You develop yellow or green discharge from your nose over 3 days. You have coughing fits You develop a severe head ache or visual changes. You develop shortness of breath, difficulty breathing or start having chest pain Your symptoms persist after you have completed your treatment plan  MAKE SURE YOU  Understand these instructions. Will watch your condition. Will get help right away if you are not doing well or get worse.  Thank you for choosing an e-visit.  Your e-visit answers were reviewed by a board certified advanced clinical practitioner to complete your personal care plan. Depending upon the condition, your plan could have included both over the counter or prescription medications.  Please review your pharmacy choice. Make sure the pharmacy is open so you can pick up prescription now. If there is a problem, you may contact your provider through MyChart messaging and have the prescription routed to another pharmacy.  Your safety is important to us. If you have drug allergies check your prescription carefully.   For the next 24 hours you can use MyChart to ask questions about today's visit, request a non-urgent call back, or ask for a work or school excuse. You will get an email in the next two days asking about your experience. I hope that your e-visit has been valuable and will speed your recovery.    Approximately 5 minutes was used in reviewing the patient's chart, questionnaire, prescribing medications, and documentation.  

## 2021-05-27 ENCOUNTER — Other Ambulatory Visit: Payer: Self-pay | Admitting: Family Medicine

## 2021-05-27 MED ORDER — BUPROPION HCL ER (XL) 150 MG PO TB24: 150.0000 mg | ORAL_TABLET | Freq: Every day | ORAL | 0 refills | Status: DC

## 2021-05-29 DIAGNOSIS — F331 Major depressive disorder, recurrent, moderate: Secondary | ICD-10-CM | POA: Diagnosis not present

## 2021-05-30 ENCOUNTER — Encounter: Payer: Self-pay | Admitting: Family Medicine

## 2021-06-03 ENCOUNTER — Ambulatory Visit: Payer: Medicaid Other | Admitting: Family Medicine

## 2021-06-16 DIAGNOSIS — F331 Major depressive disorder, recurrent, moderate: Secondary | ICD-10-CM | POA: Diagnosis not present

## 2021-06-28 DIAGNOSIS — F331 Major depressive disorder, recurrent, moderate: Secondary | ICD-10-CM | POA: Diagnosis not present

## 2021-07-04 ENCOUNTER — Other Ambulatory Visit: Payer: Self-pay | Admitting: Family Medicine

## 2021-07-05 ENCOUNTER — Other Ambulatory Visit: Payer: Self-pay | Admitting: Family Medicine

## 2021-07-06 ENCOUNTER — Other Ambulatory Visit: Payer: Self-pay | Admitting: Family Medicine

## 2021-07-06 ENCOUNTER — Encounter: Payer: Self-pay | Admitting: *Deleted

## 2021-07-06 ENCOUNTER — Other Ambulatory Visit: Payer: Self-pay | Admitting: *Deleted

## 2021-07-06 ENCOUNTER — Encounter: Payer: Self-pay | Admitting: Family Medicine

## 2021-07-06 MED ORDER — BUPROPION HCL ER (XL) 150 MG PO TB24: 150.0000 mg | ORAL_TABLET | Freq: Every day | ORAL | 0 refills | Status: AC

## 2021-07-18 ENCOUNTER — Encounter: Payer: Self-pay | Admitting: Radiology

## 2021-07-19 DIAGNOSIS — F331 Major depressive disorder, recurrent, moderate: Secondary | ICD-10-CM | POA: Diagnosis not present

## 2021-07-21 ENCOUNTER — Telehealth: Payer: Self-pay

## 2021-07-21 NOTE — Telephone Encounter (Signed)
Spoke to pt advised that appt for 6/21 should be ok for her med check. Pt states she thought it was in August

## 2021-08-02 ENCOUNTER — Other Ambulatory Visit: Payer: Self-pay | Admitting: Family Medicine

## 2021-08-02 DIAGNOSIS — F418 Other specified anxiety disorders: Secondary | ICD-10-CM

## 2021-08-05 MED ORDER — ESCITALOPRAM OXALATE 20 MG PO TABS: 40.0000 mg | ORAL_TABLET | Freq: Every day | ORAL | 0 refills | Status: DC

## 2021-08-11 DIAGNOSIS — F331 Major depressive disorder, recurrent, moderate: Secondary | ICD-10-CM | POA: Diagnosis not present

## 2021-08-23 DIAGNOSIS — F331 Major depressive disorder, recurrent, moderate: Secondary | ICD-10-CM | POA: Diagnosis not present

## 2021-08-24 ENCOUNTER — Ambulatory Visit: Payer: Medicaid Other | Admitting: Family Medicine

## 2021-08-24 ENCOUNTER — Encounter: Payer: Self-pay | Admitting: Family Medicine

## 2021-09-12 DIAGNOSIS — F331 Major depressive disorder, recurrent, moderate: Secondary | ICD-10-CM | POA: Diagnosis not present

## 2021-10-06 DIAGNOSIS — F331 Major depressive disorder, recurrent, moderate: Secondary | ICD-10-CM | POA: Diagnosis not present

## 2021-10-12 ENCOUNTER — Encounter (INDEPENDENT_AMBULATORY_CARE_PROVIDER_SITE_OTHER): Payer: Self-pay

## 2021-10-24 DIAGNOSIS — F331 Major depressive disorder, recurrent, moderate: Secondary | ICD-10-CM | POA: Diagnosis not present

## 2021-11-09 DIAGNOSIS — F331 Major depressive disorder, recurrent, moderate: Secondary | ICD-10-CM | POA: Diagnosis not present

## 2021-11-14 ENCOUNTER — Encounter: Payer: Medicaid Other | Admitting: Family Medicine

## 2021-12-01 DIAGNOSIS — F331 Major depressive disorder, recurrent, moderate: Secondary | ICD-10-CM | POA: Diagnosis not present

## 2021-12-12 ENCOUNTER — Encounter: Payer: Medicaid Other | Admitting: Family Medicine

## 2021-12-16 ENCOUNTER — Other Ambulatory Visit: Payer: Self-pay | Admitting: Family Medicine

## 2021-12-16 DIAGNOSIS — F418 Other specified anxiety disorders: Secondary | ICD-10-CM

## 2021-12-18 ENCOUNTER — Other Ambulatory Visit: Payer: Self-pay | Admitting: Family Medicine

## 2021-12-18 DIAGNOSIS — F418 Other specified anxiety disorders: Secondary | ICD-10-CM

## 2021-12-28 DIAGNOSIS — F331 Major depressive disorder, recurrent, moderate: Secondary | ICD-10-CM | POA: Diagnosis not present

## 2022-01-05 ENCOUNTER — Ambulatory Visit (INDEPENDENT_AMBULATORY_CARE_PROVIDER_SITE_OTHER): Payer: Medicaid Other | Admitting: Family Medicine

## 2022-01-05 ENCOUNTER — Encounter: Payer: Self-pay | Admitting: Family Medicine

## 2022-01-05 VITALS — BP 120/81 | HR 88 | Temp 98.1°F | Resp 16 | Wt 273.0 lb

## 2022-01-05 DIAGNOSIS — Z130 Encounter for screening for diseases of the blood and blood-forming organs and certain disorders involving the immune mechanism: Secondary | ICD-10-CM | POA: Diagnosis not present

## 2022-01-05 DIAGNOSIS — Z0000 Encounter for general adult medical examination without abnormal findings: Secondary | ICD-10-CM | POA: Diagnosis not present

## 2022-01-05 DIAGNOSIS — Z13220 Encounter for screening for lipoid disorders: Secondary | ICD-10-CM

## 2022-01-05 DIAGNOSIS — Z1159 Encounter for screening for other viral diseases: Secondary | ICD-10-CM | POA: Diagnosis not present

## 2022-01-05 DIAGNOSIS — Z1329 Encounter for screening for other suspected endocrine disorder: Secondary | ICD-10-CM

## 2022-01-05 DIAGNOSIS — Z13228 Encounter for screening for other metabolic disorders: Secondary | ICD-10-CM | POA: Diagnosis not present

## 2022-01-05 DIAGNOSIS — E6609 Other obesity due to excess calories: Secondary | ICD-10-CM | POA: Diagnosis not present

## 2022-01-05 DIAGNOSIS — Z6841 Body Mass Index (BMI) 40.0 and over, adult: Secondary | ICD-10-CM | POA: Diagnosis not present

## 2022-01-06 ENCOUNTER — Encounter: Payer: Self-pay | Admitting: Family Medicine

## 2022-01-06 LAB — CBC WITH DIFFERENTIAL/PLATELET
Basophils Absolute: 0 10*3/uL (ref 0.0–0.2)
Basos: 0 %
EOS (ABSOLUTE): 0.1 10*3/uL (ref 0.0–0.4)
Eos: 1 %
Hematocrit: 37 % (ref 34.0–46.6)
Hemoglobin: 12 g/dL (ref 11.1–15.9)
Immature Grans (Abs): 0 10*3/uL (ref 0.0–0.1)
Immature Granulocytes: 0 %
Lymphocytes Absolute: 2.7 10*3/uL (ref 0.7–3.1)
Lymphs: 35 %
MCH: 29.1 pg (ref 26.6–33.0)
MCHC: 32.4 g/dL (ref 31.5–35.7)
MCV: 90 fL (ref 79–97)
Monocytes Absolute: 0.3 10*3/uL (ref 0.1–0.9)
Monocytes: 4 %
Neutrophils Absolute: 4.7 10*3/uL (ref 1.4–7.0)
Neutrophils: 60 %
Platelets: 340 10*3/uL (ref 150–450)
RBC: 4.12 x10E6/uL (ref 3.77–5.28)
RDW: 14.5 % (ref 11.7–15.4)
WBC: 7.8 10*3/uL (ref 3.4–10.8)

## 2022-01-06 LAB — LIPID PANEL
Chol/HDL Ratio: 4.2 ratio (ref 0.0–4.4)
Cholesterol, Total: 152 mg/dL (ref 100–199)
HDL: 36 mg/dL — ABNORMAL LOW (ref >39)
LDL Chol Calc (NIH): 100 mg/dL — ABNORMAL HIGH (ref 0–99)
Triglycerides: 84 mg/dL (ref 0–149)
VLDL Cholesterol Cal: 16 mg/dL (ref 5–40)

## 2022-01-06 LAB — CMP14+EGFR
ALT: 18 IU/L (ref 0–32)
AST: 15 IU/L (ref 0–40)
Albumin/Globulin Ratio: 1.3 (ref 1.2–2.2)
Albumin: 4 g/dL (ref 3.9–4.9)
Alkaline Phosphatase: 113 IU/L (ref 44–121)
BUN/Creatinine Ratio: 13 (ref 9–23)
BUN: 7 mg/dL (ref 6–20)
Bilirubin Total: 0.3 mg/dL (ref 0.0–1.2)
CO2: 23 mmol/L (ref 20–29)
Calcium: 9.2 mg/dL (ref 8.7–10.2)
Chloride: 102 mmol/L (ref 96–106)
Creatinine, Ser: 0.56 mg/dL — ABNORMAL LOW (ref 0.57–1.00)
Globulin, Total: 3 g/dL (ref 1.5–4.5)
Glucose: 144 mg/dL — ABNORMAL HIGH (ref 70–99)
Potassium: 3.9 mmol/L (ref 3.5–5.2)
Sodium: 140 mmol/L (ref 134–144)
Total Protein: 7 g/dL (ref 6.0–8.5)
eGFR: 125 mL/min/{1.73_m2} (ref >59)

## 2022-01-06 LAB — HEPATITIS C ANTIBODY: Hep C Virus Ab: NONREACTIVE

## 2022-01-06 LAB — HEMOGLOBIN A1C
Est. average glucose Bld gHb Est-mCnc: 126 mg/dL
Hgb A1c MFr Bld: 6 % — ABNORMAL HIGH (ref 4.8–5.6)

## 2022-01-06 LAB — TSH: TSH: 1.17 u[IU]/mL (ref 0.450–4.500)

## 2022-01-06 NOTE — Progress Notes (Signed)
Established Patient Office Visit  Subjective    Patient ID: Kelly Morales, female    DOB: 05-20-1990  Age: 31 y.o. MRN: [id number]  CC:  Chief Complaint  Patient presents with   Annual Exam    HPI Kelly Morales presents for routine annual exam. Patient denisa cute complaints or concerns.    Outpatient Encounter Medications as of 01/05/2022  Medication Sig   benzonatate (TESSALON) 100 MG capsule Take 1 capsule (100 mg total) by mouth 2 (two) times daily as needed for cough.   buPROPion (WELLBUTRIN XL) 150 MG 24 hr tablet Take 1 tablet (150 mg total) by mouth daily.   escitalopram (LEXAPRO) 20 MG tablet Take 2 tablets (40 mg total) by mouth daily.   ipratropium (ATROVENT) 0.03 % nasal spray Place 2 sprays into both nostrils every 12 (twelve) hours.   levonorgestrel (LILETTA, 52 MG,) 19.5 MCG/DAY IUD IUD 1 each by Intrauterine route once.   No facility-administered encounter medications on file as of 01/05/2022.    Past Medical History:  Diagnosis Date   Anemia    Gestational diabetes    Gestational diabetes mellitus (GDM) controlled on oral hypoglycemic drug, antepartum 01/15/2018   Diagnosis: 2-hr glucola at 28-weeks (abrnomal fasting and 1-hr) Current Diabetic Medications:  None  _0  Aspirin 81 mg daily after 12 weeks (? A2/B GDM)  Required Referrals for A1GDM or A2GDM: _1  Diabetes Education and Equities trader - Rx sent  _2  Nutrition Consult - message sent   For A2/B GDM or higher classes of DM _3  Diabetes Education and Testing Supplies _4  Nutrition Counsult _5  Fetal    No past surgical history on file.  Family History  Problem Relation Age of Onset   Diabetes Mother    Hypertension Mother    Hypertension Maternal Grandmother     Social History   Socioeconomic History   Marital status: Significant Other    Spouse name: Not on file   Number of children: Not on file   Years of education: Not on file   Highest education level: Not on file   Occupational History   Not on file  Tobacco Use   Smoking status: Some Days    Types: Cigarettes    Last attempt to quit: 2019    Years since quitting: 4.8   Smokeless tobacco: Never  Vaping Use   Vaping Use: Former   Quit date: 08/04/2017  Substance and Sexual Activity   Alcohol use: Not Currently   Drug use: Never   Sexual activity: Yes    Birth control/protection: None  Other Topics Concern   Not on file  Social History Narrative   Not on file   Social Determinants of Health   Financial Resource Strain: Not on file  Food Insecurity: No Food Insecurity (03/20/2018)   Hunger Vital Sign    Worried About Running Out of Food in the Last Year: Never true    Ran Out of Food in the Last Year: Never true  Transportation Needs: No Transportation Needs (03/20/2018)   PRAPARE - Hydrologist (Medical): No    Lack of Transportation (Non-Medical): No  Physical Activity: Not on file  Stress: Not on file  Social Connections: Not on file  Intimate Partner Violence: Not on file    Review of Systems  All other systems reviewed and are negative.       Objective    BP 120/81   Pulse 88  Temp 98.1 F (36.7 C) (Oral)   Resp 16   Wt 273 lb (123.8 kg)   SpO2 98%   BMI 40.32 kg/m   Physical Exam Vitals and nursing note reviewed.  Constitutional:      General: She is not in acute distress.    Appearance: She is obese.  HENT:     Head: Normocephalic and atraumatic.     Right Ear: Tympanic membrane, ear canal and external ear normal.     Left Ear: Tympanic membrane, ear canal and external ear normal.     Nose: Nose normal.     Mouth/Throat:     Mouth: Mucous membranes are moist.     Pharynx: Oropharynx is clear.  Eyes:     Conjunctiva/sclera: Conjunctivae normal.     Pupils: Pupils are equal, round, and reactive to light.  Neck:     Thyroid: No thyromegaly.  Cardiovascular:     Rate and Rhythm: Normal rate and regular rhythm.     Heart  sounds: Normal heart sounds. No murmur heard. Pulmonary:     Effort: Pulmonary effort is normal. No respiratory distress.     Breath sounds: Normal breath sounds.  Abdominal:     General: There is no distension.     Palpations: Abdomen is soft. There is no mass.     Tenderness: There is no abdominal tenderness.  Musculoskeletal:        General: Normal range of motion.     Cervical back: Normal range of motion and neck supple.  Skin:    General: Skin is warm and dry.  Neurological:     General: No focal deficit present.     Mental Status: She is alert and oriented to person, place, and time.  Psychiatric:        Mood and Affect: Mood normal.        Behavior: Behavior normal.         Assessment & Plan:   1. Annual physical exam  - CMP14+EGFR  2. Screening for deficiency anemia  - CBC with Differential  3. Screening for lipid disorders  - Lipid Panel  4. Screening for endocrine/metabolic/immunity disorders  - Hemoglobin A1c - TSH  5. Need for hepatitis C screening test  - Hepatitis C Antibody    Return in about 6 months (around 07/06/2022) for follow up.   Becky Sax, MD

## 2022-01-14 ENCOUNTER — Other Ambulatory Visit: Payer: Self-pay | Admitting: Family Medicine

## 2022-01-14 DIAGNOSIS — F418 Other specified anxiety disorders: Secondary | ICD-10-CM

## 2022-01-15 DIAGNOSIS — F331 Major depressive disorder, recurrent, moderate: Secondary | ICD-10-CM | POA: Diagnosis not present

## 2022-01-31 ENCOUNTER — Encounter: Payer: Self-pay | Admitting: Family Medicine

## 2022-02-07 DIAGNOSIS — F331 Major depressive disorder, recurrent, moderate: Secondary | ICD-10-CM | POA: Diagnosis not present

## 2022-02-10 ENCOUNTER — Telehealth: Payer: Medicaid Other | Admitting: Physician Assistant

## 2022-02-10 ENCOUNTER — Telehealth: Payer: Medicaid Other | Admitting: Family Medicine

## 2022-02-10 DIAGNOSIS — H66002 Acute suppurative otitis media without spontaneous rupture of ear drum, left ear: Secondary | ICD-10-CM | POA: Diagnosis not present

## 2022-02-10 MED ORDER — NEOMYCIN-POLYMYXIN-HC 3.5-10000-1 OT SOLN: 3.0000 [drp] | Freq: Four times a day (QID) | OTIC | 0 refills | Status: AC

## 2022-02-10 MED ORDER — AMOXICILLIN 500 MG PO CAPS: 500.0000 mg | ORAL_CAPSULE | Freq: Two times a day (BID) | ORAL | 0 refills | Status: AC

## 2022-02-10 NOTE — Progress Notes (Signed)
Virtual Visit Consent   Kelly Morales, you are scheduled for a virtual visit with a [facility] provider today. Just as with appointments in the office, your consent must be obtained to participate. Your consent will be active for this visit and any virtual visit you may have with one of our providers in the next 365 days. If you have a MyChart account, a copy of this consent can be sent to you electronically.  As this is a virtual visit, video technology does not allow for your provider to perform a traditional examination. This may limit your provider's ability to fully assess your condition. If your provider identifies any concerns that need to be evaluated in person or the need to arrange testing (such as labs, EKG, etc.), we will make arrangements to do so. Although advances in technology are sophisticated, we cannot ensure that it will always work on either your end or our end. If the connection with a video visit is poor, the visit may have to be switched to a telephone visit. With either a video or telephone visit, we are not always able to ensure that we have a secure connection.  By engaging in this virtual visit, you consent to the provision of healthcare and authorize for your insurance to be billed (if applicable) for the services provided during this visit. Depending on your insurance coverage, you may receive a charge related to this service.  I need to obtain your verbal consent now. Are you willing to proceed with your visit today? Kelly Morales has provided verbal consent on 02/10/2022 for a virtual visit (video or telephone). Kelly Loveless, PA-C  Date: 02/10/2022 11:24 AM  Virtual Visit via Video Note   I, Kelly Morales, connected with  Kelly Morales  ([id number], 02/22/1991) on 02/10/22 at 11:15 AM EST by a video-enabled telemedicine application and verified that I am speaking with the correct person using two  identifiers.  Location: Patient: Virtual Visit Location Patient: Home Provider: Virtual Visit Location Provider: Home Office   I discussed the limitations of evaluation and management by telemedicine and the availability of in person appointments. The patient expressed understanding and agreed to proceed.    History of Present Illness: Kelly Morales is a 31 y.o. who identifies as a female who was assigned female at birth, and is being seen today for otalgia.  HPI: Otalgia  There is pain in the left ear. This is a new problem. The current episode started yesterday. The problem occurs constantly. The problem has been gradually worsening. There has been no fever. The pain is moderate. Associated symptoms include a sore throat (started bilaterally yesterday, then localized to the left with ear pain). Pertinent negatives include no coughing, diarrhea, ear discharge, headaches, hearing loss, neck pain or rhinorrhea. Associated symptoms comments: Swollen lymph node same side, left, superior anterior chain. She has tried NSAIDs (ibuprofen, olive oil) for the symptoms. The treatment provided no relief. There is no history of a chronic ear infection, hearing loss or a tympanostomy tube.     Problems:  Patient Active Problem List   Diagnosis Date Noted   Former smoker 04/03/2019   History of gestational diabetes 03/30/2018   Poor dentition 09/18/2017   BMI 39.0-39.9,adult 05/25/2017   Mixed anxiety and depressive disorder 07/24/2014   Iron deficiency anemia 02/13/2014    Allergies: No Known Allergies Medications:  Current Outpatient Medications:    amoxicillin (AMOXIL) 500 MG capsule, Take 1 capsule (500 mg total) by  mouth 2 (two) times daily for 10 days., Disp: 20 capsule, Rfl: 0   neomycin-polymyxin-hydrocortisone (CORTISPORIN) OTIC solution, Place 3 drops into the left ear 4 (four) times daily., Disp: 10 mL, Rfl: 0   benzonatate (TESSALON) 100 MG capsule, Take 1 capsule (100 mg total)  by mouth 2 (two) times daily as needed for cough., Disp: 20 capsule, Rfl: 0   buPROPion (WELLBUTRIN XL) 150 MG 24 hr tablet, Take 1 tablet (150 mg total) by mouth daily., Disp: 30 tablet, Rfl: 0   escitalopram (LEXAPRO) 20 MG tablet, TAKE 2 TABLETS (40 MG TOTAL) BY MOUTH DAILY., Disp: 60 tablet, Rfl: 1   ipratropium (ATROVENT) 0.03 % nasal spray, Place 2 sprays into both nostrils every 12 (twelve) hours., Disp: 30 mL, Rfl: 0   levonorgestrel (LILETTA, 52 MG,) 19.5 MCG/DAY IUD IUD, 1 each by Intrauterine route once., Disp: , Rfl:   Observations/Objective: Patient is well-developed, well-nourished in no acute distress.  Resting comfortably at home.  Head is normocephalic, atraumatic.  No labored breathing.  Speech is clear and coherent with logical content.  Patient is alert and oriented at baseline.    Assessment and Plan: 1. Non-recurrent acute suppurative otitis media of left ear without spontaneous rupture of tympanic membrane - amoxicillin (AMOXIL) 500 MG capsule; Take 1 capsule (500 mg total) by mouth 2 (two) times daily for 10 days.  Dispense: 20 capsule; Refill: 0 - neomycin-polymyxin-hydrocortisone (CORTISPORIN) OTIC solution; Place 3 drops into the left ear 4 (four) times daily.  Dispense: 10 mL; Refill: 0  - Worsening symptoms that have not responded to OTC medications.  - Will give Amoxicillin and Cortisporin - Continue saline nasal rinses - Could consider to add Flonase (Fluticasone) nasal spray over the counter for possible eustachian tube dysfunction - Steam and humidifier can help - Warm compress to ear - Stay well hydrated and get plenty of rest.  - Seek in person evaluation if no symptom improvement or if symptoms worsen   Follow Up Instructions: I discussed the assessment and treatment plan with the patient. The patient was provided an opportunity to ask questions and all were answered. The patient agreed with the plan and demonstrated an understanding of the  instructions.  A copy of instructions were sent to the patient via MyChart unless otherwise noted below.    The patient was advised to call back or seek an in-person evaluation if the symptoms worsen or if the condition fails to improve as anticipated.  Time:  I spent 10 minutes with the patient via telehealth technology discussing the above problems/concerns.    Kelly Loveless, PA-C

## 2022-02-10 NOTE — Patient Instructions (Signed)
Kelly Morales, thank you for joining Kelly Loveless, PA-C for today's virtual visit.  While this provider is not your primary care provider (PCP), if your PCP is located in our provider database this encounter information will be shared with them immediately following your visit.   A [facility] MyChart account gives you access to today's visit and all your visits, tests, and labs performed at [facility] " click here if you don't have a [facility] MyChart account or go to [email or web address]  Consent: (Patient) Kelly Morales provided verbal consent for this virtual visit at the beginning of the encounter.  Current Medications:  Current Outpatient Medications:    amoxicillin (AMOXIL) 500 MG capsule, Take 1 capsule (500 mg total) by mouth 2 (two) times daily for 10 days., Disp: 20 capsule, Rfl: 0   neomycin-polymyxin-hydrocortisone (CORTISPORIN) OTIC solution, Place 3 drops into the left ear 4 (four) times daily., Disp: 10 mL, Rfl: 0   benzonatate (TESSALON) 100 MG capsule, Take 1 capsule (100 mg total) by mouth 2 (two) times daily as needed for cough., Disp: 20 capsule, Rfl: 0   buPROPion (WELLBUTRIN XL) 150 MG 24 hr tablet, Take 1 tablet (150 mg total) by mouth daily., Disp: 30 tablet, Rfl: 0   escitalopram (LEXAPRO) 20 MG tablet, TAKE 2 TABLETS (40 MG TOTAL) BY MOUTH DAILY., Disp: 60 tablet, Rfl: 1   ipratropium (ATROVENT) 0.03 % nasal spray, Place 2 sprays into both nostrils every 12 (twelve) hours., Disp: 30 mL, Rfl: 0   levonorgestrel (LILETTA, 52 MG,) 19.5 MCG/DAY IUD IUD, 1 each by Intrauterine route once., Disp: , Rfl:    Medications ordered in this encounter:  Meds ordered this encounter  Medications   amoxicillin (AMOXIL) 500 MG capsule    Sig: Take 1 capsule (500 mg total) by mouth 2 (two) times daily for 10 days.    Dispense:  20 capsule    Refill:  0    Order Specific Question:   Supervising Provider    Answer:   Merrilee Jansky  [id number]   neomycin-polymyxin-hydrocortisone (CORTISPORIN) OTIC solution    Sig: Place 3 drops into the left ear 4 (four) times daily.    Dispense:  10 mL    Refill:  0    Order Specific Question:   Supervising Provider    Answer:   Merrilee Jansky [[id number]]     *If you need refills on other medications prior to your next appointment, please contact your pharmacy*  Follow-Up: Call back or seek an in-person evaluation if the symptoms worsen or if the condition fails to improve as anticipated.  [facility] [phone]  Other Instructions  Otitis Media, Adult  Otitis media occurs when there is inflammation and fluid in the middle ear with signs and symptoms of an acute infection. The middle ear is a part of the ear that contains bones for hearing as well as air that helps send sounds to the brain. When infected fluid builds up in this space, it causes pressure and can lead to an ear infection. The eustachian tube connects the middle ear to the back of the nose (nasopharynx) and normally allows air into the middle ear. If the eustachian tube becomes blocked, fluid can build up and become infected. What are the causes? This condition is caused by a blockage in the eustachian tube. This can be caused by mucus or by swelling of the tube. Problems that can cause a blockage include: A cold or  other upper respiratory infection. Allergies. An irritant, such as tobacco smoke. Enlarged adenoids. The adenoids are areas of soft tissue located high in the back of the throat, behind the nose and the roof of the mouth. They are part of the body's defense system (immune system). A mass in the nasopharynx. Damage to the ear caused by pressure changes (barotrauma). What increases the risk? You are more likely to develop this condition if you: Smoke or are exposed to tobacco smoke. Have an opening in the roof of your mouth (cleft palate). Have gastroesophageal reflux. Have an immune  system disorder. What are the signs or symptoms? Symptoms of this condition include: Ear pain. Fever. Decreased hearing. Tiredness (lethargy). Fluid leaking from the ear, if the eardrum is ruptured or has burst. Ringing in the ear. How is this diagnosed?  This condition is diagnosed with a physical exam. During the exam, your health care provider will use an instrument called an otoscope to look in your ear and check for redness, swelling, and fluid. He or she will also ask about your symptoms. Your health care provider may also order tests, such as: A pneumatic otoscopy. This is a test to check the movement of the eardrum. It is done by squeezing a small amount of air into the ear. A tympanogram. This is a test that shows how well the eardrum moves in response to air pressure in the ear canal. It provides a graph for your health care provider to review. How is this treated? This condition can go away on its own within 3-5 days. But if the condition is caused by a bacterial infection and does not go away on its own, or if it keeps coming back, your health care provider may: Prescribe antibiotic medicine to treat the infection. Prescribe or recommend medicines to control pain. Follow these instructions at home: Take over-the-counter and prescription medicines only as told by your health care provider. If you were prescribed an antibiotic medicine, take it as told by your health care provider. Do not stop taking the antibiotic even if you start to feel better. Keep all follow-up visits. This is important. Contact a health care provider if: You have bleeding from your nose. There is a lump on your neck. You are not feeling better in 5 days. You feel worse instead of better. Get help right away if: You have severe pain that is not controlled with medicine. You have swelling, redness, or pain around your ear. You have stiffness in your neck. A part of your face is not moving  (paralyzed). The bone behind your ear (mastoid bone) is tender when you touch it. You develop a severe headache. Summary Otitis media is redness, soreness, and swelling of the middle ear, usually resulting in pain and decreased hearing. This condition can go away on its own within 3-5 days. If the problem does not go away in 3-5 days, your health care provider may give you medicines to treat the infection. If you were prescribed an antibiotic medicine, take it as told by your health care provider. Follow all instructions that were given to you by your health care provider. This information is not intended to replace advice given to you by your health care provider. Make sure you discuss any questions you have with your health care provider. Document Revised: 05/31/2020 Document Reviewed: 05/31/2020 Elsevier Patient Education  [place].    If you have been instructed to have an in-person evaluation today at a local Urgent Care  facility, please use the link below. It will take you to a list of all of our available [facility], including address, phone number and hours of operation. Please do not delay care.  [facility]  If you or a family member do not have a primary care provider, use the link below to schedule a visit and establish care. When you choose a [facility] primary care physician or advanced practice provider, you gain a long-term partner in health. Find a Primary Care Provider  Learn more about [facility] in-office and virtual care options: [facility]

## 2022-02-10 NOTE — Progress Notes (Signed)
The patient no-showed for appointment despite this provider sending direct link, reaching out via phone with no response and waiting for at least 10 minutes from appointment time for patient to join. They will be marked as a NS for this appointment/time.   Zakeria Kulzer M Taleia Sadowski, NP    

## 2022-03-02 DIAGNOSIS — F331 Major depressive disorder, recurrent, moderate: Secondary | ICD-10-CM | POA: Diagnosis not present

## 2022-03-23 ENCOUNTER — Ambulatory Visit: Payer: Medicaid Other | Admitting: Family Medicine

## 2022-04-04 DIAGNOSIS — F331 Major depressive disorder, recurrent, moderate: Secondary | ICD-10-CM | POA: Diagnosis not present

## 2022-04-10 ENCOUNTER — Encounter: Payer: Self-pay | Admitting: Family Medicine

## 2022-04-10 ENCOUNTER — Other Ambulatory Visit: Payer: Self-pay | Admitting: Family Medicine

## 2022-04-10 DIAGNOSIS — F418 Other specified anxiety disorders: Secondary | ICD-10-CM

## 2022-04-11 ENCOUNTER — Other Ambulatory Visit: Payer: Self-pay | Admitting: Family Medicine

## 2022-04-11 ENCOUNTER — Ambulatory Visit: Payer: Medicaid Other | Admitting: Obstetrics and Gynecology

## 2022-04-11 DIAGNOSIS — F418 Other specified anxiety disorders: Secondary | ICD-10-CM

## 2022-04-11 MED ORDER — ESCITALOPRAM OXALATE 20 MG PO TABS: 40.0000 mg | ORAL_TABLET | Freq: Every day | ORAL | 3 refills | Status: DC

## 2022-04-18 DIAGNOSIS — F331 Major depressive disorder, recurrent, moderate: Secondary | ICD-10-CM | POA: Diagnosis not present

## 2022-05-21 ENCOUNTER — Other Ambulatory Visit: Payer: Self-pay | Admitting: Family Medicine

## 2022-05-21 DIAGNOSIS — F418 Other specified anxiety disorders: Secondary | ICD-10-CM

## 2022-06-07 ENCOUNTER — Ambulatory Visit: Payer: Medicaid Other | Admitting: Family Medicine

## 2022-06-19 ENCOUNTER — Encounter: Payer: Self-pay | Admitting: Family Medicine

## 2022-06-28 ENCOUNTER — Ambulatory Visit: Payer: Medicaid Other | Admitting: Family Medicine

## 2022-08-24 ENCOUNTER — Other Ambulatory Visit: Payer: Self-pay | Admitting: Family Medicine

## 2022-08-24 DIAGNOSIS — F418 Other specified anxiety disorders: Secondary | ICD-10-CM

## 2022-08-24 NOTE — Telephone Encounter (Signed)
Requested medication (s) are due for refill today: Yes  Requested medication (s) are on the active medication list: Yes  Last refill:  05/22/22  Future visit scheduled: No  Notes to clinic:  Pharmacy requests 90 day supply and diagnosis code.    Requested Prescriptions  Pending Prescriptions Disp Refills   escitalopram (LEXAPRO) 20 MG tablet [Pharmacy Med Name: ESCITALOPRAM 20 MG TABLET] 180 tablet 1    Sig: TAKE 2 TABLETS (40 MG TOTAL) BY MOUTH DAILY.     Psychiatry:  Antidepressants - SSRI Failed - 08/24/2022 12:30 PM      Failed - Valid encounter within last 6 months    Recent Outpatient Visits           7 months ago Annual physical exam   [facility], MD   1 year ago Mixed anxiety and depressive disorder   [facility], MD   2 years ago Umbilical hernia without obstruction and without gangrene   [facility], Kandee Keen, MD   3 years ago Acne vulgaris   [place] [facility], Kandee Keen, MD   3 years ago Mixed anxiety and depressive disorder   [facility], MD              Passed - Completed PHQ-2 or PHQ-9 in the last 360 days

## 2022-09-04 ENCOUNTER — Telehealth: Payer: Self-pay | Admitting: Family Medicine

## 2022-09-04 NOTE — Telephone Encounter (Signed)
Called pt and left vm to call office back to schedule appt request via MyChart.

## 2022-09-15 ENCOUNTER — Other Ambulatory Visit: Payer: Self-pay | Admitting: Family Medicine

## 2022-09-15 DIAGNOSIS — F418 Other specified anxiety disorders: Secondary | ICD-10-CM

## 2022-09-15 NOTE — Telephone Encounter (Signed)
Requested Prescriptions  Pending Prescriptions Disp Refills   escitalopram (LEXAPRO) 20 MG tablet [Pharmacy Med Name: ESCITALOPRAM 20 MG TABLET] 180 tablet 0    Sig: TAKE 2 TABLETS (40 MG TOTAL) BY MOUTH DAILY.     Psychiatry:  Antidepressants - SSRI Failed - 09/15/2022 12:44 PM      Failed - Valid encounter within last 6 months    Recent Outpatient Visits           8 months ago Annual physical exam   [facility], MD   1 year ago Mixed anxiety and depressive disorder   [facility], MD   2 years ago Umbilical hernia without obstruction and without gangrene   [place] [facility], Kandee Keen, MD   3 years ago Acne vulgaris   [place], Kandee Keen, MD   3 years ago Mixed anxiety and depressive disorder   [place] [facility], Kandee Keen, MD       Future Appointments             In 1 week Georganna Skeans, MD [facility] : [facility] - Completed PHQ-2 or PHQ-9 in the last 360 days

## 2022-09-26 ENCOUNTER — Encounter: Payer: Self-pay | Admitting: Family Medicine

## 2022-09-26 ENCOUNTER — Ambulatory Visit: Payer: Medicaid Other | Admitting: Family Medicine

## 2022-09-26 NOTE — Telephone Encounter (Signed)
Please resend if appropriate.

## 2022-09-27 ENCOUNTER — Other Ambulatory Visit: Payer: Self-pay | Admitting: Family Medicine

## 2022-09-27 DIAGNOSIS — F418 Other specified anxiety disorders: Secondary | ICD-10-CM

## 2022-09-27 MED ORDER — ESCITALOPRAM OXALATE 20 MG PO TABS: 40.0000 mg | ORAL_TABLET | Freq: Every day | ORAL | 0 refills | Status: DC

## 2022-09-27 NOTE — Telephone Encounter (Signed)
Escitalopram prescribed 09/27/2022 by Georganna Skeans, MD.

## 2022-09-29 ENCOUNTER — Ambulatory Visit: Payer: Self-pay | Admitting: *Deleted

## 2022-09-29 NOTE — Telephone Encounter (Signed)
Please clarify medication.

## 2022-09-29 NOTE — Telephone Encounter (Signed)
Summary: directions for lexapro   Theron Arista called in from [facility] pharmacy, called in to clarify directions for Lexapro, if its 2 pills by mouth daily         Chief Complaint: medication clarification for lexapro  Symptoms: na Frequency: na Pertinent Negatives: Patient denies na Disposition: [] ED /[] Urgent Care (no appt availability in office) / [] Appointment(In office/virtual)/ []  [facility]/ [x] Home Care/ [] Refused Recommended Disposition /[] [facility]/ []  Follow-up with PCP Additional Notes:   Reviewed directions for administration for Lexapro  with Verl Bangs. Certified pharmacy tech from [employer] . Reviewed lexapro 20 mg tablets : take 2 tablets ( 40 mg total)  by mouth daily. Dispense 180 tablet. Pharmacy tech verbalized understanding.      Reason for Disposition  Pharmacy calling with prescription question and triager answers question  Answer Assessment - Initial Assessment Questions 1. NAME of MEDICINE: "What medicine(s) are you calling about?"     Lexapro  2. QUESTION: "What is your question?" (e.g., double dose of medicine, side effect)     Clarify directions  3. PRESCRIBER: "Who prescribed the medicine?" Reason: if prescribed by specialist, call should be referred to that group.     PCP 4. SYMPTOMS: "Do you have any symptoms?" If Yes, ask: "What symptoms are you having?"  "How bad are the symptoms (e.g., mild, moderate, severe)     na 5. PREGNANCY:  "Is there any chance that you are pregnant?" "When was your last menstrual period?"     na  Protocols used: Medication Question Call-A-AH

## 2022-10-24 ENCOUNTER — Ambulatory Visit: Payer: Medicaid Other | Admitting: Family Medicine

## 2022-11-01 ENCOUNTER — Ambulatory Visit: Payer: Medicaid Other | Admitting: Obstetrics and Gynecology

## 2023-01-02 ENCOUNTER — Telehealth: Payer: Medicaid Other | Admitting: Family Medicine

## 2023-01-02 ENCOUNTER — Ambulatory Visit: Payer: Medicaid Other | Admitting: Family Medicine

## 2023-01-02 DIAGNOSIS — E6609 Other obesity due to excess calories: Secondary | ICD-10-CM

## 2023-01-02 NOTE — Progress Notes (Unsigned)
Virtual Visit via Video Note  I connected with [name] on 01/02/23 at  3:40 PM EDT by a video enabled telemedicine application and verified that I am speaking with the correct person using two identifiers.  Location: Patient: Kelly Morales Provider: [place]   I discussed the limitations of evaluation and management by telemedicine and the availability of in person appointments. The patient expressed understanding and agreed to proceed.  History of Present Illness: Patient reports that she is wanting to see what meds she is eligible for for weight loss. She is interested in ozempic.    Observations/Objective:   Assessment and Plan: 1. Obesity due to excess calories without serious comorbidity, unspecified class Discussed with patient that there are certain criteria to be met for insurance to pay for injectable meds for weight loss. She will most likely need an in office eval to determine if she meets those criteria. Patient voiced understanding.    Follow Up Instructions:     I discussed the assessment and treatment plan with the patient. The patient was provided an opportunity to ask questions and all were answered. The patient agreed with the plan and demonstrated an understanding of the instructions.   The patient was advised to call back or seek an in-person evaluation if the symptoms worsen or if the condition fails to improve as anticipated.  I provided 7 minutes of non-face-to-face time during this encounter.   Tommie Raymond, MD

## 2023-01-03 ENCOUNTER — Encounter: Payer: Self-pay | Admitting: Family Medicine

## 2023-01-03 ENCOUNTER — Ambulatory Visit: Payer: Medicaid Other | Admitting: Family Medicine

## 2023-01-03 VITALS — BP 112/76 | HR 78 | Temp 99.0°F | Resp 16 | Ht 69.75 in | Wt 279.0 lb

## 2023-01-03 DIAGNOSIS — R7303 Prediabetes: Secondary | ICD-10-CM

## 2023-01-03 DIAGNOSIS — Z7689 Persons encountering health services in other specified circumstances: Secondary | ICD-10-CM | POA: Diagnosis not present

## 2023-01-03 DIAGNOSIS — E66813 Obesity, class 3: Secondary | ICD-10-CM | POA: Diagnosis not present

## 2023-01-03 DIAGNOSIS — Z6841 Body Mass Index (BMI) 40.0 and over, adult: Secondary | ICD-10-CM

## 2023-01-03 LAB — POCT GLYCOSYLATED HEMOGLOBIN (HGB A1C): Hemoglobin A1C: 5.9 % — AB (ref 4.0–5.6)

## 2023-01-04 ENCOUNTER — Encounter: Payer: Self-pay | Admitting: Family Medicine

## 2023-01-04 NOTE — Telephone Encounter (Signed)
Please advise patient what's next

## 2023-01-05 ENCOUNTER — Encounter: Payer: Self-pay | Admitting: Family Medicine

## 2023-01-05 ENCOUNTER — Other Ambulatory Visit: Payer: Self-pay

## 2023-01-05 MED ORDER — OZEMPIC (0.25 OR 0.5 MG/DOSE) 2 MG/3ML ~~LOC~~ SOPN: 0.2500 mg | PEN_INJECTOR | SUBCUTANEOUS | 0 refills | Status: DC

## 2023-01-05 NOTE — Progress Notes (Signed)
Established Patient Office Visit  Subjective    Patient ID: Kelly Morales, female    DOB: 05-07-1990  Age: 32 y.o. MRN: [id number]  CC:  Chief Complaint  Patient presents with   Follow-up    Labs and weight loss, right arm pain    HPI Kelly Morales presents for weight management and follow up of prediabetes. Patient reports that she has been trying different modalities to lose weight. She would like to try Ozempic. Patient denies acute complaints.   Outpatient Encounter Medications as of 01/03/2023  Medication Sig   escitalopram (LEXAPRO) 20 MG tablet Take 2 tablets (40 mg total) by mouth daily.   ipratropium (ATROVENT) 0.03 % nasal spray Place 2 sprays into both nostrils every 12 (twelve) hours.   levonorgestrel (LILETTA, 52 MG,) 19.5 MCG/DAY IUD IUD 1 each by Intrauterine route once.   Semaglutide,0.25 or 0.5MG /DOS, (OZEMPIC, 0.25 OR 0.5 MG/DOSE,) 2 MG/3ML SOPN Inject 0.25 mg into the skin every 7 (seven) days.   benzonatate (TESSALON) 100 MG capsule Take 1 capsule (100 mg total) by mouth 2 (two) times daily as needed for cough. (Patient not taking: Reported on 01/03/2023)   buPROPion (WELLBUTRIN XL) 150 MG 24 hr tablet Take 1 tablet (150 mg total) by mouth daily. (Patient not taking: Reported on 01/03/2023)   neomycin-polymyxin-hydrocortisone (CORTISPORIN) OTIC solution Place 3 drops into the left ear 4 (four) times daily. (Patient not taking: Reported on 01/03/2023)   No facility-administered encounter medications on file as of 01/03/2023.    Past Medical History:  Diagnosis Date   Anemia    Gestational diabetes    Gestational diabetes mellitus (GDM) controlled on oral hypoglycemic drug, antepartum 01/15/2018   Diagnosis: 2-hr glucola at 28-weeks (abrnomal fasting and 1-hr) Current Diabetic Medications:  None  [ ]  Aspirin 81 mg daily after 12 weeks (? A2/B GDM)  Required Referrals for A1GDM or A2GDM: [ ]  Diabetes Education and Child psychotherapist - Rx sent  [  ] Nutrition Consult - message sent   For A2/B GDM or higher classes of DM [ ]  Diabetes Education and Testing Supplies [ ]  Nutrition Counsult [ ]  Fetal    History reviewed. No pertinent surgical history.  Family History  Problem Relation Age of Onset   Diabetes Mother    Hypertension Mother    Hypertension Maternal Grandmother     Social History   Socioeconomic History   Marital status: Single    Spouse name: Not on file   Number of children: Not on file   Years of education: Not on file   Highest education level: Not on file  Occupational History   Not on file  Tobacco Use   Smoking status: Some Days    Current packs/day: 0.00    Types: Cigarettes    Last attempt to quit: 2019    Years since quitting: 5.8   Smokeless tobacco: Never  Vaping Use   Vaping status: Former   Quit date: 08/04/2017  Substance and Sexual Activity   Alcohol use: Not Currently   Drug use: Never   Sexual activity: Yes    Birth control/protection: None  Other Topics Concern   Not on file  Social History Narrative   Not on file   Social Determinants of Health   Financial Resource Strain: Low Risk  (01/03/2023)   Overall Financial Resource Strain (CARDIA)    Difficulty of Paying Living Expenses: Not hard at all  Food Insecurity: No Food Insecurity (01/03/2023)   Hunger Vital  Sign    Worried About Programme researcher, broadcasting/film/video in the Last Year: Never true    Ran Out of Food in the Last Year: Never true  Transportation Needs: No Transportation Needs (01/03/2023)   PRAPARE - Administrator, Civil Service (Medical): No    Lack of Transportation (Non-Medical): No  Physical Activity: Insufficiently Active (01/03/2023)   Exercise Vital Sign    Days of Exercise per Week: 5 days    Minutes of Exercise per Session: 20 min  Stress: Stress Concern Present (01/03/2023)   [employer] of Occupational Health - Occupational Stress Questionnaire    Feeling of Stress : To some extent  Social  Connections: Socially Isolated (01/03/2023)   Social Connection and Isolation Panel [NHANES]    Frequency of Communication with Friends and Family: More than three times a week    Frequency of Social Gatherings with Friends and Family: Twice a week    Attends Religious Services: Never    Database administrator or Organizations: No    Attends Banker Meetings: Never    Marital Status: Never married  Intimate Partner Violence: Not At Risk (01/03/2023)   Humiliation, Afraid, Rape, and Kick questionnaire    Fear of Current or Ex-Partner: No    Emotionally Abused: No    Physically Abused: No    Sexually Abused: No    Review of Systems  All other systems reviewed and are negative.       Objective    BP 112/76 (BP Location: Left Arm, Patient Position: Sitting, Cuff Size: Large)   Pulse 78   Temp 99 F (37.2 C) (Oral)   Resp 16   Ht 5' 9.75" (1.772 m)   Wt 279 lb (126.6 kg)   SpO2 97%   BMI 40.32 kg/m   Physical Exam Vitals and nursing note reviewed.  Constitutional:      General: She is not in acute distress.    Appearance: She is obese.  Cardiovascular:     Rate and Rhythm: Normal rate and regular rhythm.  Pulmonary:     Effort: Pulmonary effort is normal.     Breath sounds: Normal breath sounds.  Abdominal:     Palpations: Abdomen is soft.     Tenderness: There is no abdominal tenderness.  Neurological:     General: No focal deficit present.     Mental Status: She is alert and oriented to person, place, and time.         Assessment & Plan:   1. Prediabetes Patient A1c continues to be prediabetic - POCT glycosylated hemoglobin (Hb A1C)  2. Class 3 severe obesity due to excess calories without serious comorbidity with body mass index (BMI) of 40.0 to 44.9 in adult [facility]) Will prescribe ozempic for management of prediabtes and weight loss    3. Encounter for weight management    Return in about 4 weeks (around 01/31/2023) for follow up.    Kelly Raymond, MD

## 2023-01-11 ENCOUNTER — Other Ambulatory Visit: Payer: Self-pay | Admitting: Family Medicine

## 2023-01-11 ENCOUNTER — Other Ambulatory Visit: Payer: Self-pay

## 2023-01-11 MED ORDER — WEGOVY 0.25 MG/0.5ML ~~LOC~~ SOAJ: 0.2500 mg | SUBCUTANEOUS | 0 refills | Status: DC

## 2023-01-11 NOTE — Telephone Encounter (Signed)
Please advise if you are gong to order wegovy per advice of pharmacy tech Tresa Endo

## 2023-01-19 ENCOUNTER — Other Ambulatory Visit: Payer: Self-pay

## 2023-01-29 NOTE — Telephone Encounter (Signed)
Please  advise patient on any other advise

## 2023-02-09 ENCOUNTER — Other Ambulatory Visit: Payer: Self-pay | Admitting: Family Medicine

## 2023-02-09 MED ORDER — TIRZEPATIDE-WEIGHT MANAGEMENT 2.5 MG/0.5ML ~~LOC~~ SOLN: 2.5000 mg | SUBCUTANEOUS | 0 refills | Status: DC

## 2023-02-23 ENCOUNTER — Other Ambulatory Visit: Payer: Self-pay

## 2023-02-23 ENCOUNTER — Telehealth: Payer: Self-pay

## 2023-02-23 NOTE — Telephone Encounter (Signed)
Pharmacy Patient Advocate Encounter   Received notification from CoverMyMeds that prior authorization for ZEPBOUND is required/requested.   Insurance verification completed.   The patient is insured through [facility] .   Per test claim: PA required; PA submitted to above mentioned insurance via CoverMyMeds Key/confirmation #/EOC [id number] Status is pending

## 2023-02-25 ENCOUNTER — Other Ambulatory Visit: Payer: Self-pay | Admitting: Family Medicine

## 2023-03-02 NOTE — Telephone Encounter (Signed)
  Pharmacy Patient Advocate Encounter  Received notification from [facility] that Prior Authorization for ZEPBOUND has been DENIED.  Full denial letter will be uploaded to the media tab. See denial reason below.   PA #/Case ID/Reference #: [id number]  PER HEALTHY BLUE: We may be able to  approve this drug in certain situations (when you have had a trial and failure of Wegovy,  including completion of 3 to 6 months of Wegovy to complete dose titration and determine your  side effect profile; or when you have a documented contraindication to [facility]).  **PATIENT MAY NEED TO CALL AROUND TO DIFFERENT PHARMACIES TO FIND STOCK OF WEGOVY FOR CONTINUED THERAPY

## 2023-03-03 ENCOUNTER — Encounter: Payer: Self-pay | Admitting: Family Medicine

## 2023-03-13 ENCOUNTER — Other Ambulatory Visit: Payer: Self-pay | Admitting: Family Medicine

## 2023-03-13 MED ORDER — HYDROCORTISONE ACETATE 1 % EX OINT: TOPICAL_OINTMENT | CUTANEOUS | 0 refills | Status: AC

## 2023-03-14 ENCOUNTER — Telehealth: Payer: Medicaid Other | Admitting: Family Medicine

## 2023-03-14 ENCOUNTER — Encounter: Payer: Self-pay | Admitting: Family Medicine

## 2023-03-14 DIAGNOSIS — Z713 Dietary counseling and surveillance: Secondary | ICD-10-CM | POA: Diagnosis not present

## 2023-03-14 DIAGNOSIS — Z7689 Persons encountering health services in other specified circumstances: Secondary | ICD-10-CM

## 2023-03-14 MED ORDER — WEGOVY 0.5 MG/0.5ML ~~LOC~~ SOAJ: 0.5000 mg | SUBCUTANEOUS | 0 refills | Status: DC

## 2023-03-14 NOTE — Addendum Note (Signed)
 Addended by: Tommie Raymond on: 03/14/2023 02:24 PM   Modules accepted: Level of Service

## 2023-03-14 NOTE — Progress Notes (Signed)
 Virtual Visit via Video Note  I connected with [name] on 03/14/23 at  1:40 PM EST by a video enabled telemedicine application and verified that I am speaking with the correct person using two identifiers.  Location: Patient: Kelly Morales Provider: [place]   I discussed the limitations of evaluation and management by telemedicine and the availability of in person appointments. The patient expressed understanding and agreed to proceed.  History of Present Illness: Patient reports that she has gotten authorization and has taken wegovy  for 4 weeks with a weight loss of about 12 lbs. She would like to continue the wegovy  and increase the dosage. She denies any acute complaints.    Observations/Objective:   Assessment and Plan: 1. Encounter for weight management (Primary) Wegovy  0.5mg  prescribed.    Follow Up Instructions: Patient to present for an in-person OV for evaluation and management in 4 weeks.    I discussed the assessment and treatment plan with the patient. The patient was provided an opportunity to ask questions and all were answered. The patient agreed with the plan and demonstrated an understanding of the instructions.   The patient was advised to call back or seek an in-person evaluation if the symptoms worsen or if the condition fails to improve as anticipated.  I provided 5 minutes of non-face-to-face time during this encounter.   Tanda Raguel SQUIBB, MD

## 2023-03-15 ENCOUNTER — Other Ambulatory Visit: Payer: Self-pay | Admitting: Family Medicine

## 2023-03-27 DIAGNOSIS — F320 Major depressive disorder, single episode, mild: Secondary | ICD-10-CM | POA: Diagnosis not present

## 2023-04-08 NOTE — Progress Notes (Unsigned)
ANNUAL EXAM Patient name: Kelly Morales MRN [id number]  Date of birth: [birth year] Chief Complaint:   No chief complaint on file.  History of Present Illness:   Kelly Morales is a 33 y.o. [phone] {race:25618} female being seen today for a routine annual exam.  Current complaints: ***  No LMP recorded. (Menstrual status: IUD).   The pregnancy intention screening data noted above was reviewed. Potential methods of contraception were discussed. The patient elected to proceed with No data recorded.   Last pap ***. Results were: {Pap findings:25134}. H/O abnormal pap: {yes/yes***/no:23866} Last mammogram: ***. Results were: {normal, abnormal, n/a:23837}. Family h/o breast cancer: {yes***/no:23838} Last colonoscopy: ***. Results were: {normal, abnormal, n/a:23837}. Family h/o colorectal cancer: {yes***/no:23838}     01/03/2023    1:13 PM 01/05/2022    2:22 PM 03/31/2021    9:25 AM 12/15/2020   10:12 AM 06/01/2020    3:52 PM  Depression screen PHQ 2/9  Decreased Interest 1 2 1 1 2   Down, Depressed, Hopeless 0  1 2 2   PHQ - 2 Score 1 2 2 3 4   Altered sleeping  2 1 2 2   Tired, decreased energy  2 1 2 1   Change in appetite  2 1 2 2   Feeling bad or failure about yourself   2 1 2 1   Trouble concentrating  2 1 0 0  Moving slowly or fidgety/restless  2 1 0 0  Suicidal thoughts  0 0 0 0  PHQ-9 Score  14 8 11 10   Difficult doing work/chores   Not difficult at all          01/03/2023    1:14 PM 12/15/2020   10:12 AM 06/01/2020    3:51 PM 06/03/2019    1:34 PM  GAD 7 : Generalized Anxiety Score  Nervous, Anxious, on Edge 0 1 0 1  Control/stop worrying 0 2 0 1  Worry too much - different things 0 2 0 2  Trouble relaxing 0 2 0 2  Restless 1 2  1   Easily annoyed or irritable 1 3 1 2   Afraid - awful might happen 0 2 0 0  Total GAD 7 Score 2 14  9   Anxiety Difficulty Somewhat difficult        Review of Systems:   Pertinent items are noted in HPI Denies any  headaches, blurred vision, fatigue, shortness of breath, chest pain, abdominal pain, abnormal vaginal discharge/itching/odor/irritation, problems with periods, bowel movements, urination, or intercourse unless otherwise stated above. Pertinent History Reviewed:  Reviewed past medical,surgical, social and family history.  Reviewed problem list, medications and allergies. Physical Assessment:  There were no vitals filed for this visit.There is no height or weight on file to calculate BMI.        Physical Examination:   General appearance - well appearing, and in no distress  Mental status - alert, oriented to person, place, and time  Psych:  She has a normal mood and affect  Skin - warm and dry, normal color, no suspicious lesions noted  Chest - effort normal, all lung fields clear to auscultation bilaterally  Heart - normal rate and regular rhythm  Neck:  midline trachea, no thyromegaly or nodules  Breasts - breasts appear normal, no suspicious masses, no skin or nipple changes or  axillary nodes  Abdomen - soft, nontender, nondistended, no masses or organomegaly  Pelvic - VULVA: normal appearing vulva with no masses, tenderness or lesions  VAGINA: normal appearing vagina with  normal color and discharge, no lesions  CERVIX: normal appearing cervix without discharge or lesions, no CMT  Thin prep pap is {Desc; done/not:10129} *** HR HPV cotesting  UTERUS: uterus is felt to be normal size, shape, consistency and nontender   ADNEXA: No adnexal masses or tenderness noted.  Rectal - normal rectal, good sphincter tone, no masses felt. Hemoccult: ***  Extremities:  No swelling or varicosities noted  Chaperone present for exam  No results found for this or any previous visit (from the past 24 hours).  Assessment & Plan:  1) Well-Woman Exam  2) ***  Labs/procedures today: ***  Mammogram: {Mammo f/u:25212::"@ 33yo"}, or sooner if problems Colonoscopy: {TCS f/u:25213::"@ 33yo"}, or sooner if  problems  No orders of the defined types were placed in this encounter.   Meds: No orders of the defined types were placed in this encounter.   Follow-up: No follow-ups on file.  Richardson Landry, CNM 04/08/2023 3:09 AM

## 2023-04-09 ENCOUNTER — Ambulatory Visit (INDEPENDENT_AMBULATORY_CARE_PROVIDER_SITE_OTHER): Payer: Self-pay | Admitting: Certified Nurse Midwife

## 2023-04-09 DIAGNOSIS — Z124 Encounter for screening for malignant neoplasm of cervix: Secondary | ICD-10-CM

## 2023-04-09 DIAGNOSIS — Z30432 Encounter for removal of intrauterine contraceptive device: Secondary | ICD-10-CM

## 2023-04-09 DIAGNOSIS — Z0000 Encounter for general adult medical examination without abnormal findings: Secondary | ICD-10-CM

## 2023-04-09 DIAGNOSIS — Z91199 Patient's noncompliance with other medical treatment and regimen due to unspecified reason: Secondary | ICD-10-CM

## 2023-05-01 ENCOUNTER — Telehealth (INDEPENDENT_AMBULATORY_CARE_PROVIDER_SITE_OTHER): Payer: Medicaid Other | Admitting: Family Medicine

## 2023-05-01 ENCOUNTER — Ambulatory Visit: Payer: Medicaid Other | Admitting: Family Medicine

## 2023-05-01 DIAGNOSIS — E669 Obesity, unspecified: Secondary | ICD-10-CM

## 2023-05-01 DIAGNOSIS — Z7689 Persons encountering health services in other specified circumstances: Secondary | ICD-10-CM | POA: Diagnosis not present

## 2023-05-01 MED ORDER — OZEMPIC (0.25 OR 0.5 MG/DOSE) 2 MG/3ML ~~LOC~~ SOPN: 0.2500 mg | PEN_INJECTOR | SUBCUTANEOUS | 0 refills | Status: AC

## 2023-05-01 NOTE — Progress Notes (Unsigned)
 Virtual Visit via Video Note  I connected with Kelly Morales on 05/01/23 at  4:00 PM EST by a video enabled telemedicine application and verified that I am speaking with the correct person using two identifiers.  Location: Patient: [place] Provider: [place]   I discussed the limitations of evaluation and management by telemedicine and the availability of in person appointments. The patient expressed understanding and agreed to proceed.  History of Present Illness: Patient reports that she is 89. She is having SE since she has gone up to the 0.5   Observations/Objective:   Assessment and Plan:   Follow Up Instructions:    I discussed the assessment and treatment plan with the patient. The patient was provided an opportunity to ask questions and all were answered. The patient agreed with the plan and demonstrated an understanding of the instructions.   The patient was advised to call back or seek an in-person evaluation if the symptoms worsen or if the condition fails to improve as anticipated.  I provided *** minutes of non-face-to-face time during this encounter.   Tommie Raymond, MD

## 2023-05-02 ENCOUNTER — Other Ambulatory Visit: Payer: Self-pay | Admitting: Family Medicine

## 2023-05-03 ENCOUNTER — Encounter: Payer: Self-pay | Admitting: Family Medicine

## 2023-05-03 MED ORDER — WEGOVY 0.25 MG/0.5ML ~~LOC~~ SOAJ: 0.2500 mg | SUBCUTANEOUS | 0 refills | Status: DC

## 2023-05-06 ENCOUNTER — Encounter: Payer: Self-pay | Admitting: Family Medicine

## 2023-05-08 ENCOUNTER — Other Ambulatory Visit: Payer: Self-pay | Admitting: Family Medicine

## 2023-05-08 MED ORDER — WEGOVY 0.25 MG/0.5ML ~~LOC~~ SOAJ: 0.2500 mg | SUBCUTANEOUS | 0 refills | Status: DC

## 2023-05-21 ENCOUNTER — Telehealth: Payer: Self-pay

## 2023-05-21 NOTE — Telephone Encounter (Signed)
 Can you give me a hand with this prior auth please Kelly Morales.   Ozempic 0.25 or 0.5 MG/Dose 2MG /3ML   Thank you. And sorry there might be a lot coming your way.

## 2023-05-22 ENCOUNTER — Other Ambulatory Visit: Payer: Self-pay

## 2023-06-01 ENCOUNTER — Encounter: Payer: Self-pay | Admitting: Family Medicine

## 2023-06-05 ENCOUNTER — Telehealth: Admitting: Family Medicine

## 2023-06-05 DIAGNOSIS — Z0289 Encounter for other administrative examinations: Secondary | ICD-10-CM | POA: Diagnosis not present

## 2023-06-05 DIAGNOSIS — F418 Other specified anxiety disorders: Secondary | ICD-10-CM

## 2023-06-05 NOTE — Telephone Encounter (Signed)
 Patient scheduled.

## 2023-06-05 NOTE — Progress Notes (Unsigned)
 Virtual Visit via Video Note  I connected with Kelly Morales on 06/05/23 at  3:40 PM EDT by a video enabled telemedicine application and verified that I am speaking with the correct person using two identifiers.  Location: Patient: [place] Provider: [place]   I discussed the limitations of evaluation and management by telemedicine and the availability of in person appointments. The patient expressed understanding and agreed to proceed.  History of Present Illness: Patient reports that anxiety and depression sx are making it difficult to perform at work.    Observations/Objective:   Assessment and Plan:  1. Mixed anxiety and depressive disorder (Primary) Patient to continue Lexapro. Patient to look for counselor as well.   2. Encounter for completion of form with patient FMLA form completed   Follow Up Instructions:    I discussed the assessment and treatment plan with the patient. The patient was provided an opportunity to ask questions and all were answered. The patient agreed with the plan and demonstrated an understanding of the instructions.   The patient was advised to call back or seek an in-person evaluation if the symptoms worsen or if the condition fails to improve as anticipated.  I provided 22 minutes of non-face-to-face time during this encounter.   Tommie Raymond, MD

## 2023-06-07 ENCOUNTER — Encounter: Payer: Self-pay | Admitting: Family Medicine

## 2023-06-13 ENCOUNTER — Encounter: Payer: Self-pay | Admitting: Family Medicine

## 2023-06-20 ENCOUNTER — Ambulatory Visit: Admitting: Family Medicine

## 2023-06-28 ENCOUNTER — Encounter: Payer: Self-pay | Admitting: Family Medicine

## 2023-07-05 ENCOUNTER — Encounter: Payer: Self-pay | Admitting: Family Medicine

## 2023-07-05 ENCOUNTER — Ambulatory Visit (INDEPENDENT_AMBULATORY_CARE_PROVIDER_SITE_OTHER): Admitting: Family Medicine

## 2023-07-05 VITALS — BP 122/79 | HR 85 | Ht 69.0 in | Wt 252.4 lb

## 2023-07-05 DIAGNOSIS — Z87891 Personal history of nicotine dependence: Secondary | ICD-10-CM

## 2023-07-05 DIAGNOSIS — Z13228 Encounter for screening for other metabolic disorders: Secondary | ICD-10-CM | POA: Diagnosis not present

## 2023-07-05 DIAGNOSIS — Z130 Encounter for screening for diseases of the blood and blood-forming organs and certain disorders involving the immune mechanism: Secondary | ICD-10-CM | POA: Diagnosis not present

## 2023-07-05 DIAGNOSIS — Z0000 Encounter for general adult medical examination without abnormal findings: Secondary | ICD-10-CM | POA: Diagnosis not present

## 2023-07-05 DIAGNOSIS — Z13220 Encounter for screening for lipoid disorders: Secondary | ICD-10-CM | POA: Diagnosis not present

## 2023-07-05 DIAGNOSIS — Z1329 Encounter for screening for other suspected endocrine disorder: Secondary | ICD-10-CM | POA: Diagnosis not present

## 2023-07-05 MED ORDER — ESCITALOPRAM OXALATE 20 MG PO TABS: 40.0000 mg | ORAL_TABLET | Freq: Every day | ORAL | 0 refills | Status: DC

## 2023-07-05 MED ORDER — WEGOVY 0.25 MG/0.5ML ~~LOC~~ SOAJ: 0.2500 mg | SUBCUTANEOUS | 0 refills | Status: DC

## 2023-07-05 NOTE — Progress Notes (Unsigned)
 Established Patient Office Visit  Subjective    Patient ID: Kelly Morales, female    DOB: 07-23-1990  Age: 33 y.o. MRN: [id number]  CC:  Chief Complaint  Patient presents with   Annual Exam    Patient would like to up dosage lexapro    Patient want blood work, possible blood work for PCOS and TSH, Facial hair      Medication Refill    HPI Kelly Morales presents for routine annual exam. Patient denies acute complaints.   Outpatient Encounter Medications as of 07/05/2023  Medication Sig   [DISCONTINUED] escitalopram  (LEXAPRO ) 20 MG tablet Take 2 tablets (40 mg total) by mouth daily.   [DISCONTINUED] Semaglutide -Weight Management (WEGOVY ) 0.25 MG/0.5ML SOAJ Inject 0.25 mg into the skin once a week.   [DISCONTINUED] Semaglutide -Weight Management (WEGOVY ) 0.5 MG/0.5ML SOAJ Inject 0.5 mg into the skin once a week.   benzonatate  (TESSALON ) 100 MG capsule Take 1 capsule (100 mg total) by mouth 2 (two) times daily as needed for cough. (Patient not taking: Reported on 07/05/2023)   buPROPion  (WELLBUTRIN  XL) 150 MG 24 hr tablet Take 1 tablet (150 mg total) by mouth daily. (Patient not taking: Reported on 07/05/2023)   escitalopram  (LEXAPRO ) 20 MG tablet Take 2 tablets (40 mg total) by mouth daily.   Hydrocortisone  Acetate 1 % OINT Apply topically daily prn (Patient not taking: Reported on 07/05/2023)   ipratropium (ATROVENT ) 0.03 % nasal spray Place 2 sprays into both nostrils every 12 (twelve) hours. (Patient not taking: Reported on 07/05/2023)   levonorgestrel  (LILETTA , 52 MG,) 19.5 MCG/DAY IUD IUD 1 each by Intrauterine route once. (Patient not taking: Reported on 07/05/2023)   neomycin -polymyxin-hydrocortisone  (CORTISPORIN) OTIC solution Place 3 drops into the left ear 4 (four) times daily. (Patient not taking: Reported on 07/05/2023)   Semaglutide ,0.25 or 0.5MG /DOS, (OZEMPIC , 0.25 OR 0.5 MG/DOSE,) 2 MG/3ML SOPN Inject 0.25 mg into the skin every 7 (seven) days. (Patient not taking:  Reported on 07/05/2023)   Semaglutide -Weight Management (WEGOVY ) 0.25 MG/0.5ML SOAJ Inject 0.25 mg into the skin once a week.   No facility-administered encounter medications on file as of 07/05/2023.    Past Medical History:  Diagnosis Date   Anemia    Gestational diabetes    Gestational diabetes mellitus (GDM) controlled on oral hypoglycemic drug, antepartum 01/15/2018   Diagnosis: 2-hr glucola at 28-weeks (abrnomal fasting and 1-hr) Current Diabetic Medications:  None  [ ]  Aspirin 81 mg daily after 12 weeks (? A2/B GDM)  Required Referrals for A1GDM or A2GDM: [ ]  Diabetes Education and Child psychotherapist - Rx sent  [ ]  Nutrition Consult - message sent   For A2/B GDM or higher classes of DM [ ]  Diabetes Education and Testing Supplies [ ]  Nutrition Counsult [ ]  Fetal    No past surgical history on file.  Family History  Problem Relation Age of Onset   Diabetes Mother    Hypertension Mother    Hypertension Maternal Grandmother     Social History   Socioeconomic History   Marital status: Single    Spouse name: Not on file   Number of children: Not on file   Years of education: Not on file   Highest education level: Bachelor's degree (e.g., BA, AB, BS)  Occupational History   Not on file  Tobacco Use   Smoking status: Some Days    Current packs/day: 0.00    Types: Cigarettes    Last attempt to quit: 2019    Years since quitting:  6.3   Smokeless tobacco: Never  Vaping Use   Vaping status: Former   Quit date: 08/04/2017  Substance and Sexual Activity   Alcohol use: Not Currently   Drug use: Never   Sexual activity: Yes    Birth control/protection: None  Other Topics Concern   Not on file  Social History Narrative   Not on file   Social Drivers of Health   Financial Resource Strain: Low Risk  (05/01/2023)   Overall Financial Resource Strain (CARDIA)    Difficulty of Paying Living Expenses: Not very hard  Food Insecurity: No Food Insecurity (05/01/2023)   Hunger Vital Sign     Worried About Running Out of Food in the Last Year: Never true    Ran Out of Food in the Last Year: Never true  Transportation Needs: Unmet Transportation Needs (05/01/2023)   PRAPARE - Administrator, Civil Service (Medical): Yes    Lack of Transportation (Non-Medical): No  Physical Activity: Insufficiently Active (05/01/2023)   Exercise Vital Sign    Days of Exercise per Week: 5 days    Minutes of Exercise per Session: 10 min  Stress: No Stress Concern Present (05/01/2023)   [employer] of Occupational Health - Occupational Stress Questionnaire    Feeling of Stress : Only a little  Social Connections: Socially Isolated (05/01/2023)   Social Connection and Isolation Panel [NHANES]    Frequency of Communication with Friends and Family: More than three times a week    Frequency of Social Gatherings with Friends and Family: Once a week    Attends Religious Services: Never    Database administrator or Organizations: No    Attends Banker Meetings: Never    Marital Status: Never married  Intimate Partner Violence: Not At Risk (01/03/2023)   Humiliation, Afraid, Rape, and Kick questionnaire    Fear of Current or Ex-Partner: No    Emotionally Abused: No    Physically Abused: No    Sexually Abused: No    Review of Systems  All other systems reviewed and are negative.       Objective    BP 122/79 (BP Location: Left Arm, Patient Position: Sitting, Cuff Size: Normal)   Pulse 85   Ht 5\' 9"  (1.753 m)   Wt 252 lb 6.4 oz (114.5 kg)   SpO2 97%   BMI 37.27 kg/m   Physical Exam Vitals and nursing note reviewed.  Constitutional:      General: She is not in acute distress.    Appearance: She is obese.  HENT:     Head: Normocephalic and atraumatic.     Right Ear: Tympanic membrane, ear canal and external ear normal.     Left Ear: Tympanic membrane, ear canal and external ear normal.     Nose: Nose normal.     Mouth/Throat:     Mouth: Mucous membranes  are moist.     Pharynx: Oropharynx is clear.  Eyes:     Conjunctiva/sclera: Conjunctivae normal.     Pupils: Pupils are equal, round, and reactive to light.  Neck:     Thyroid : No thyromegaly.  Cardiovascular:     Rate and Rhythm: Normal rate and regular rhythm.     Heart sounds: Normal heart sounds. No murmur heard. Pulmonary:     Effort: Pulmonary effort is normal. No respiratory distress.     Breath sounds: Normal breath sounds.  Abdominal:     General: There is no distension.  Palpations: Abdomen is soft. There is no mass.     Tenderness: There is no abdominal tenderness.  Musculoskeletal:        General: Normal range of motion.     Cervical back: Normal range of motion and neck supple.  Skin:    General: Skin is warm and dry.  Neurological:     General: No focal deficit present.     Mental Status: She is alert and oriented to person, place, and time.  Psychiatric:        Mood and Affect: Mood normal.        Behavior: Behavior normal.         Assessment & Plan:   Annual physical exam -     CMP14+EGFR  Stopped smoking between 1 and 3 months ago  Screening for deficiency anemia -     CBC with Differential/Platelet  Screening for lipid disorders -     Lipid panel  Screening for endocrine/metabolic/immunity disorders -     TSH -     Hemoglobin A1c  Other orders -     Escitalopram  Oxalate; Take 2 tablets (40 mg total) by mouth daily.  Dispense: 180 tablet; Refill: 0 -     Wegovy ; Inject 0.25 mg into the skin once a week.  Dispense: 2 mL; Refill: 0     No follow-ups on file.   Arlo Lama, MD

## 2023-07-06 LAB — CMP14+EGFR
ALT: 44 IU/L — ABNORMAL HIGH (ref 0–32)
AST: 28 IU/L (ref 0–40)
Albumin: 4.4 g/dL (ref 3.9–4.9)
Alkaline Phosphatase: 121 IU/L (ref 44–121)
BUN/Creatinine Ratio: 13 (ref 9–23)
BUN: 7 mg/dL (ref 6–20)
Bilirubin Total: 0.3 mg/dL (ref 0.0–1.2)
CO2: 19 mmol/L — ABNORMAL LOW (ref 20–29)
Calcium: 9.3 mg/dL (ref 8.7–10.2)
Chloride: 103 mmol/L (ref 96–106)
Creatinine, Ser: 0.55 mg/dL — ABNORMAL LOW (ref 0.57–1.00)
Globulin, Total: 3 g/dL (ref 1.5–4.5)
Glucose: 82 mg/dL (ref 70–99)
Potassium: 4.1 mmol/L (ref 3.5–5.2)
Sodium: 138 mmol/L (ref 134–144)
Total Protein: 7.4 g/dL (ref 6.0–8.5)
eGFR: 125 mL/min/{1.73_m2} (ref >59)

## 2023-07-06 LAB — CBC WITH DIFFERENTIAL/PLATELET
Basophils Absolute: 0 10*3/uL (ref 0.0–0.2)
Basos: 1 %
EOS (ABSOLUTE): 0.1 10*3/uL (ref 0.0–0.4)
Eos: 2 %
Hematocrit: 37.8 % (ref 34.0–46.6)
Hemoglobin: 12.2 g/dL (ref 11.1–15.9)
Immature Grans (Abs): 0 10*3/uL (ref 0.0–0.1)
Immature Granulocytes: 1 %
Lymphocytes Absolute: 2.6 10*3/uL (ref 0.7–3.1)
Lymphs: 39 %
MCH: 29.8 pg (ref 26.6–33.0)
MCHC: 32.3 g/dL (ref 31.5–35.7)
MCV: 92 fL (ref 79–97)
Monocytes Absolute: 0.3 10*3/uL (ref 0.1–0.9)
Monocytes: 5 %
Neutrophils Absolute: 3.6 10*3/uL (ref 1.4–7.0)
Neutrophils: 52 %
Platelets: 364 10*3/uL (ref 150–450)
RBC: 4.1 x10E6/uL (ref 3.77–5.28)
RDW: 14.9 % (ref 11.7–15.4)
WBC: 6.6 10*3/uL (ref 3.4–10.8)

## 2023-07-06 LAB — HEMOGLOBIN A1C
Est. average glucose Bld gHb Est-mCnc: 114 mg/dL
Hgb A1c MFr Bld: 5.6 % (ref 4.8–5.6)

## 2023-07-06 LAB — LIPID PANEL
Chol/HDL Ratio: 4.5 ratio — ABNORMAL HIGH (ref 0.0–4.4)
Cholesterol, Total: 179 mg/dL (ref 100–199)
HDL: 40 mg/dL (ref >39)
LDL Chol Calc (NIH): 127 mg/dL — ABNORMAL HIGH (ref 0–99)
Triglycerides: 62 mg/dL (ref 0–149)
VLDL Cholesterol Cal: 12 mg/dL (ref 5–40)

## 2023-07-06 LAB — TSH: TSH: 0.98 u[IU]/mL (ref 0.450–4.500)

## 2023-07-09 ENCOUNTER — Encounter: Payer: Self-pay | Admitting: Family Medicine

## 2023-07-09 ENCOUNTER — Ambulatory Visit: Admitting: Family Medicine

## 2023-07-17 ENCOUNTER — Ambulatory Visit: Admitting: Family Medicine

## 2023-07-25 ENCOUNTER — Ambulatory Visit: Admitting: Family Medicine

## 2023-07-26 NOTE — Telephone Encounter (Signed)
 Reason for CRM: Pt called to check if next appt could be changed to virtual. Did not change because she mentioned Wegovy  and was unsure if weight check or bloodwork would be required. Thank You

## 2023-07-26 NOTE — Telephone Encounter (Signed)
 Patient scheduled.

## 2023-07-27 ENCOUNTER — Ambulatory Visit: Admitting: Family Medicine

## 2023-07-30 ENCOUNTER — Encounter: Payer: Self-pay | Admitting: Family Medicine

## 2023-07-31 NOTE — Telephone Encounter (Signed)
 Patient scheduled.

## 2023-08-01 ENCOUNTER — Encounter: Payer: Self-pay | Admitting: Family Medicine

## 2023-08-01 ENCOUNTER — Other Ambulatory Visit: Payer: Self-pay

## 2023-08-01 ENCOUNTER — Telehealth (INDEPENDENT_AMBULATORY_CARE_PROVIDER_SITE_OTHER): Admitting: Family Medicine

## 2023-08-01 DIAGNOSIS — F418 Other specified anxiety disorders: Secondary | ICD-10-CM | POA: Diagnosis not present

## 2023-08-01 MED ORDER — BREXPIPRAZOLE 1 MG PO TABS: 1.0000 mg | ORAL_TABLET | Freq: Every day | ORAL | 0 refills | Status: DC

## 2023-08-01 NOTE — Telephone Encounter (Signed)
 PA needed

## 2023-08-01 NOTE — Progress Notes (Unsigned)
 Virtual Visit via Video Note  I connected with Kelly Morales on 08/01/23 at  1:40 PM EDT by a video enabled telemedicine application and verified that I am speaking with the correct person using two identifiers.  Location: Patient: [name] Provider:    I discussed the limitations of evaluation and management by telemedicine and the availability of in person appointments. The patient expressed understanding and agreed to proceed.  History of Present Illness: Patient reports that she is doing much better with the rexulti added to her regimen. She would like to continue.    Observations/Objective:   Assessment and Plan: 1. Mixed anxiety and depressive disorder (Primary) Improved with present regimen. Rexulti 1 mg daily prescribed.    Follow Up Instructions:    I discussed the assessment and treatment plan with the patient. The patient was provided an opportunity to ask questions and all were answered. The patient agreed with the plan and demonstrated an understanding of the instructions.   The patient was advised to call back or seek an in-person evaluation if the symptoms worsen or if the condition fails to improve as anticipated.  I provided 6 minutes of non-face-to-face time during this encounter.   Arlo Lama, MD

## 2023-08-02 ENCOUNTER — Encounter: Payer: Self-pay | Admitting: Family Medicine

## 2023-08-02 ENCOUNTER — Ambulatory Visit: Admitting: Family Medicine

## 2023-08-03 ENCOUNTER — Ambulatory Visit: Admitting: Obstetrics and Gynecology

## 2023-08-09 MED ORDER — BREXPIPRAZOLE 1 MG PO TABS: 1.0000 mg | ORAL_TABLET | Freq: Every day | ORAL | 0 refills | Status: DC

## 2023-08-09 NOTE — Telephone Encounter (Signed)
 I spoke with patent and medication has been  sent to new pharmacy. All other pharmacies does not cover

## 2023-08-14 ENCOUNTER — Encounter: Payer: Self-pay | Admitting: Family Medicine

## 2023-08-15 NOTE — Telephone Encounter (Signed)
 Reason for CRM: The patient would like to be contacted to schedule a Friday afternoon appt with their PCP. Please contact the patient further when possible

## 2023-08-20 ENCOUNTER — Encounter: Payer: Self-pay | Admitting: Family Medicine

## 2023-08-28 ENCOUNTER — Other Ambulatory Visit: Payer: Self-pay | Admitting: Family Medicine

## 2023-08-28 ENCOUNTER — Telehealth: Payer: Self-pay | Admitting: *Deleted

## 2023-08-28 ENCOUNTER — Other Ambulatory Visit: Payer: Self-pay

## 2023-08-28 MED ORDER — WEGOVY 0.25 MG/0.5ML ~~LOC~~ SOAJ: 0.2500 mg | SUBCUTANEOUS | 0 refills | Status: DC

## 2023-08-28 NOTE — Telephone Encounter (Signed)
Complete  sent

## 2023-08-28 NOTE — Telephone Encounter (Signed)
 Patient do not want to go up on dose because it bothers her stomach. Patient would like to continue .25 Wegvoy

## 2023-08-29 ENCOUNTER — Other Ambulatory Visit: Payer: Self-pay

## 2023-09-21 ENCOUNTER — Other Ambulatory Visit (HOSPITAL_COMMUNITY)
Admission: RE | Admit: 2023-09-21 | Discharge: 2023-09-21 | Disposition: A | Discharge status: Home / Self Care | Source: Ambulatory Visit | Attending: Obstetrics and Gynecology | Admitting: Obstetrics and Gynecology

## 2023-09-21 ENCOUNTER — Other Ambulatory Visit: Payer: Self-pay

## 2023-09-21 ENCOUNTER — Ambulatory Visit (INDEPENDENT_AMBULATORY_CARE_PROVIDER_SITE_OTHER): Admitting: Obstetrics and Gynecology

## 2023-09-21 ENCOUNTER — Encounter: Payer: Self-pay | Admitting: Obstetrics and Gynecology

## 2023-09-21 VITALS — BP 112/76 | HR 74 | Wt 266.0 lb

## 2023-09-21 DIAGNOSIS — N939 Abnormal uterine and vaginal bleeding, unspecified: Secondary | ICD-10-CM

## 2023-09-21 DIAGNOSIS — Z30432 Encounter for removal of intrauterine contraceptive device: Secondary | ICD-10-CM | POA: Diagnosis not present

## 2023-09-21 DIAGNOSIS — Z113 Encounter for screening for infections with a predominantly sexual mode of transmission: Secondary | ICD-10-CM | POA: Diagnosis not present

## 2023-09-21 MED ORDER — TRANEXAMIC ACID 650 MG PO TABS: 1300.0000 mg | ORAL_TABLET | Freq: Three times a day (TID) | ORAL | 11 refills | Status: AC

## 2023-09-21 NOTE — Progress Notes (Signed)
 NEW GYNECOLOGY PATIENT Patient name: Kelly Morales MRN [id number]  Date of birth: [birth year] Chief Complaint:   IUD Removal     History:  Kelly Morales is a 33 y.o. [phone] being seen today for IUD removal.  Having irregular periods with the IUD in place. Has heavy periods without IUD in place. Has compelted childbearing. Liletta  worked well overall and may have it re-placed later one. Not currently sexually active. CHC contraindications: None. Would like STI testing today, vaginal screening only as she had unprotected intercourse since her last testing and wants to be sure. She has not been having any abnormal vaginal discharge or symptoms.      Gynecologic History Patient's last menstrual period was 09/05/2023. Contraception: abstinence Last Pap:     Component Value Date/Time   DIAGPAP  12/15/2020 1012    - Negative for intraepithelial lesion or malignancy (NILM)   DIAGPAP  09/18/2017 0000    NEGATIVE FOR INTRAEPITHELIAL LESIONS OR MALIGNANCY.   DIAGPAP  09/18/2017 0000    FUNGAL ORGANISMS PRESENT CONSISTENT WITH CANDIDA SPP.   HPVHIGH Negative 12/15/2020 1012   ADEQPAP  12/15/2020 1012    Satisfactory for evaluation; transformation zone component PRESENT.   ADEQPAP  09/18/2017 0000    Satisfactory for evaluation  endocervical/transformation zone component PRESENT.    High Risk HPV: Positive  Adequacy:  Satisfactory for evaluation, transformation zone component PRESENT  Diagnosis:  Atypical squamous cells of undetermined significance (ASC-US )  Last Mammogram: n/a Last Colonoscopy: n/a  Obstetric History OB History  Gravida Para Term Preterm AB Living  5 3 3  2 3   SAB IAB Ectopic Multiple Live Births  1 1   3     # Outcome Date GA Lbr Len/2nd Weight Sex Type Anes PTL Lv  5 Term 03/31/18 [redacted]w[redacted]d / 00:25 9 lb 10.9 oz (4.39 kg) M Vag-Spont EPI  LIV     Birth Comments: WNL  4 Term 09/13/14 [redacted]w[redacted]d  8 lb (3.629 kg) M Vag-Spont EPI N LIV  3 Term 08/30/11  [redacted]w[redacted]d  7 lb 14 oz (3.572 kg) M Vag-Spont EPI  LIV  2 IAB           1 SAB             Past Medical History:  Diagnosis Date   Anemia    Gestational diabetes    Gestational diabetes mellitus (GDM) controlled on oral hypoglycemic drug, antepartum 01/15/2018   Diagnosis: 2-hr glucola at 28-weeks (abrnomal fasting and 1-hr) Current Diabetic Medications:  None  [ ]  Aspirin 81 mg daily after 12 weeks (? A2/B GDM)  Required Referrals for A1GDM or A2GDM: [ ]  Diabetes Education and Child psychotherapist - Rx sent  [ ]  Nutrition Consult - message sent   For A2/B GDM or higher classes of DM [ ]  Diabetes Education and Testing Supplies [ ]  Nutrition Counsult [ ]  Fetal    No past surgical history on file.  Current Outpatient Medications on File Prior to Visit  Medication Sig Dispense Refill   brexpiprazole  (REXULTI ) 1 MG TABS tablet Take 1 tablet (1 mg total) by mouth daily. 90 tablet 0   escitalopram  (LEXAPRO ) 20 MG tablet Take 2 tablets (40 mg total) by mouth daily. 180 tablet 0   Semaglutide -Weight Management (WEGOVY ) 0.25 MG/0.5ML SOAJ Inject 0.25 mg into the skin once a week. 2 mL 0   benzonatate  (TESSALON ) 100 MG capsule Take 1 capsule (100 mg total) by mouth 2 (two) times daily as needed for cough. (  Patient not taking: Reported on 07/05/2023) 20 capsule 0   buPROPion  (WELLBUTRIN  XL) 150 MG 24 hr tablet Take 1 tablet (150 mg total) by mouth daily. (Patient not taking: Reported on 07/05/2023) 30 tablet 0   Hydrocortisone  Acetate 1 % OINT Apply topically daily prn (Patient not taking: Reported on 07/05/2023) 28.4 g 0   ipratropium (ATROVENT ) 0.03 % nasal spray Place 2 sprays into both nostrils every 12 (twelve) hours. (Patient not taking: Reported on 07/05/2023) 30 mL 0   levonorgestrel  (LILETTA , 52 MG,) 19.5 MCG/DAY IUD IUD 1 each by Intrauterine route once. (Patient not taking: Reported on 09/21/2023)     neomycin -polymyxin-hydrocortisone  (CORTISPORIN) OTIC solution Place 3 drops into the left ear 4 (four) times  daily. (Patient not taking: Reported on 07/05/2023) 10 mL 0   Semaglutide ,0.25 or 0.5MG /DOS, (OZEMPIC , 0.25 OR 0.5 MG/DOSE,) 2 MG/3ML SOPN Inject 0.25 mg into the skin every 7 (seven) days. (Patient not taking: Reported on 07/05/2023) 3 mL 0   No current facility-administered medications on file prior to visit.    No Known Allergies  Social History:  reports that she has been smoking cigarettes. She has never used smokeless tobacco. She reports that she does not currently use alcohol. She reports that she does not use drugs.  Family History  Problem Relation Age of Onset   Diabetes Mother    Hypertension Mother    Hypertension Maternal Grandmother     The following portions of the patient's history were reviewed and updated as appropriate: allergies, current medications, past family history, past medical history, past social history, past surgical history and problem list.  Review of Systems Pertinent items noted in HPI and remainder of comprehensive ROS otherwise negative.  Physical Exam:  BP 112/76   Pulse 74   Wt 266 lb (120.7 kg)   LMP 09/05/2023   BMI 39.28 kg/m  Physical Exam Vitals and nursing note reviewed. Exam conducted with a chaperone present.  Constitutional:      Appearance: Normal appearance.  Pulmonary:     Effort: Pulmonary effort is normal.  Abdominal:     Palpations: Abdomen is soft.  Genitourinary:    General: Normal vulva.     Exam position: Lithotomy position.     Comments: IUD strings visible Neurological:     Mental Status: She is alert.   IUD Removal  Patient identified, informed consent performed, consent signed.  Patient was in the dorsal lithotomy position, normal external genitalia was noted.  A speculum was placed in the patient's vagina, normal discharge was noted, no lesions. The cervix was visualized, no lesions, no abnormal discharge.  The strings of the IUD were visualized, grasped and pulled using ring forceps. The IUD was removed in its  entirety. . Patient tolerated the procedure well.    Patient will use abstinence for contraception. Routine preventative health maintenance measures emphasized.    Assessment and Plan:   1. Encounter for IUD removal (Primary) Now s/p uncomplicated IUD removal.   2. Screening for STD (sexually transmitted disease) Vaginal screening for GC/CT collected today  - Cervicovaginal ancillary only( [facility])  3. Abnormal uterine bleeding (AUB) Given script for lysteda in case of heavy bleeding. May return for liletta  re-insertion vs discussion of alternative treatments for AUB as well as LARCs. Reviewed IUDs used for contraception and AUB management as well.  - tranexamic acid (LYSTEDA) 650 MG TABS tablet; Take 2 tablets (1,300 mg total) by mouth 3 (three) times daily. Take during menses for a maximum of  five days  Dispense: 30 tablet; Refill: 11    Routine preventative health maintenance measures emphasized. Please refer to After Visit Summary for other counseling recommendations.   Follow-up: No follow-ups on file.      Carter Quarry, MD Obstetrician & Gynecologist, Faculty Practice [facility] [employer], [facility]

## 2023-09-24 LAB — CERVICOVAGINAL ANCILLARY ONLY
Chlamydia: NEGATIVE
Comment: NEGATIVE
Comment: NEGATIVE
Comment: NORMAL
Neisseria Gonorrhea: NEGATIVE
Trichomonas: NEGATIVE

## 2023-09-25 ENCOUNTER — Ambulatory Visit: Payer: Self-pay | Admitting: Obstetrics and Gynecology

## 2023-10-01 ENCOUNTER — Other Ambulatory Visit: Payer: Self-pay | Admitting: Family Medicine

## 2023-10-03 ENCOUNTER — Other Ambulatory Visit: Payer: Self-pay | Admitting: Family Medicine

## 2023-10-22 ENCOUNTER — Ambulatory Visit: Admitting: Family Medicine

## 2023-11-02 ENCOUNTER — Ambulatory Visit: Admitting: Family Medicine

## 2023-11-12 ENCOUNTER — Other Ambulatory Visit: Payer: Self-pay | Admitting: Family Medicine

## 2023-11-12 ENCOUNTER — Encounter: Payer: Self-pay | Admitting: Family Medicine

## 2023-11-13 ENCOUNTER — Other Ambulatory Visit: Payer: Self-pay | Admitting: Family Medicine

## 2023-11-14 ENCOUNTER — Other Ambulatory Visit: Payer: Self-pay | Admitting: Family Medicine

## 2023-11-14 MED ORDER — ESCITALOPRAM OXALATE 20 MG PO TABS: 20.0000 mg | ORAL_TABLET | Freq: Every day | ORAL | 0 refills | Status: DC

## 2023-12-05 ENCOUNTER — Ambulatory Visit: Admitting: Family Medicine

## 2023-12-10 ENCOUNTER — Other Ambulatory Visit: Payer: Self-pay

## 2023-12-13 ENCOUNTER — Telehealth: Payer: Self-pay

## 2023-12-13 NOTE — Telephone Encounter (Signed)
 Received letter in mail from [employer] stating starting 10/1/125 they will no longer cove pt's [facility]

## 2023-12-27 ENCOUNTER — Telehealth (INDEPENDENT_AMBULATORY_CARE_PROVIDER_SITE_OTHER): Admitting: Family Medicine

## 2023-12-27 ENCOUNTER — Encounter: Payer: Self-pay | Admitting: Family Medicine

## 2023-12-27 DIAGNOSIS — F418 Other specified anxiety disorders: Secondary | ICD-10-CM

## 2023-12-27 MED ORDER — BREXPIPRAZOLE 2 MG PO TABS: 1.0000 mg | ORAL_TABLET | Freq: Every day | ORAL | 1 refills | Status: AC

## 2023-12-27 NOTE — Progress Notes (Signed)
 Virtual Visit via Video Note  I connected with Kelly Morales on 12/27/23 at  9:00 AM EDT by a video enabled telemedicine application and verified that I am speaking with the correct person using two identifiers.  Location: Patient: [place] Provider: [place]   I discussed the limitations of evaluation and management by telemedicine and the availability of in person appointments. The patient expressed understanding and agreed to proceed.  History of Present Illness:  Patient reports that she has seen great improvement with the addition of Rexulti . She would like to increase the rexulti  from 1 mg to 2 mg daily.    Observations/Objective:   Assessment and Plan: 1. Mixed anxiety and depressive disorder (Primary) Improved. Will increase rexulti  from 1mg  to 2 mg daily.    Follow Up Instructions: Follow up in 4-6 weeks.    I discussed the assessment and treatment plan with the patient. The patient was provided an opportunity to ask questions and all were answered. The patient agreed with the plan and demonstrated an understanding of the instructions.   The patient was advised to call back or seek an in-person evaluation if the symptoms worsen or if the condition fails to improve as anticipated.  I provided 8 minutes of non-face-to-face time during this encounter.   Tanda Raguel SQUIBB, MD

## 2024-01-02 ENCOUNTER — Encounter: Payer: Self-pay | Admitting: Family Medicine

## 2024-01-03 ENCOUNTER — Telehealth: Payer: Self-pay

## 2024-01-03 NOTE — Telephone Encounter (Signed)
 Copied from CRM #[id number]. Topic: Clinical - Prescription Issue >> Jan 03, 2024 10:36 AM Darshell M wrote: Reason for CRM: Patient advised by pharmacy that the insurance is questioning why patient is taking 2 20mg  tablets of Lexapro . Provider is asked to contact insurance with justification for medication dosage. Patient is out of medication. CB# [phone]. [facility] [phone], [facility] [place] [phone]

## 2024-01-09 ENCOUNTER — Other Ambulatory Visit: Payer: Self-pay | Admitting: Family Medicine

## 2024-01-09 NOTE — Telephone Encounter (Signed)
 Spoke to pharmacist from [facility] - [facility] 2 [facility]apro  [facility] do have the new prescription for Lexapro  that is ONCE daily written by Dr. Tanda on 11/14/23. Pt is not ready for refill yet, but pharmacy stated they would go ahead and send refill request so they could have refills on file

## 2024-01-15 ENCOUNTER — Telehealth: Payer: Self-pay

## 2024-01-15 NOTE — Telephone Encounter (Signed)
 Copied from CRM [id number]. Topic: Clinical - Medication Prior Auth >> Jan 15, 2024 10:19 AM Kelly Morales wrote: Reason for CRM: PCP needs to send PA due to insurance on medication escitalopram  (LEXAPRO ) 20 MG tablet. Pt is stating she is suppose to be taking pill twice a day but prescription states Take 1 tablet by mouth once daily. Insurance can only cover what is on prescription. Please advise [phone]

## 2024-01-15 NOTE — Telephone Encounter (Signed)
 Copied from CRM #[id number]. Topic: Clinical - Prescription Issue >> Jan 14, 2024 10:01 AM Amy B wrote: Reason for CRM: Patient advised by pharmacy that the insurance is questioning why patient is taking 2 20mg  tablets of Lexapro . Provider is asked to contact [facility] [phone] to provide verbal order for refill request

## 2024-01-16 ENCOUNTER — Other Ambulatory Visit: Payer: Self-pay

## 2024-01-16 NOTE — Telephone Encounter (Unsigned)
 Copied from CRM #[id number]. Topic: Clinical - Prescription Issue >> Jan 14, 2024 10:01 AM Amy B wrote: Reason for CRM: Patient advised by pharmacy that the insurance is questioning why patient is taking 2 20mg  tablets of Lexapro . Provider is asked to contact [facility] [phone] to provide verbal order for refill request >> Jan 16, 2024  9:20 AM Logan F wrote: [facility] is calling for clarification on the Lexapro . They say the rx was written on once a day but pt need two. Please contact the pharmacy for clarification. Pharmacist [phone]

## 2024-01-25 ENCOUNTER — Other Ambulatory Visit: Payer: Self-pay | Admitting: Family Medicine

## 2024-01-25 MED ORDER — ESCITALOPRAM OXALATE 20 MG PO TABS: 40.0000 mg | ORAL_TABLET | Freq: Every day | ORAL | 1 refills | Status: AC

## 2024-03-12 ENCOUNTER — Encounter: Payer: Self-pay | Admitting: Family Medicine

## 2024-03-12 ENCOUNTER — Other Ambulatory Visit: Payer: Self-pay | Admitting: Family Medicine

## 2024-05-06 ENCOUNTER — Ambulatory Visit: Admitting: Family Medicine
